# Patient Record
Sex: Male | Born: 1950 | Race: White | Hispanic: No | Marital: Married | Smoking: Never smoker
Health system: Southern US, Community
[De-identification: ages and names within clinical notes are randomized; demographics above are authoritative.]

## PROBLEM LIST (undated history)

## (undated) DIAGNOSIS — B019 Varicella without complication: Secondary | ICD-10-CM

## (undated) DIAGNOSIS — D869 Sarcoidosis, unspecified: Secondary | ICD-10-CM

## (undated) DIAGNOSIS — I10 Essential (primary) hypertension: Secondary | ICD-10-CM

## (undated) DIAGNOSIS — H409 Unspecified glaucoma: Secondary | ICD-10-CM

## (undated) HISTORY — DX: Varicella without complication: B01.9

## (undated) HISTORY — DX: Unspecified glaucoma: H40.9

## (undated) HISTORY — DX: Sarcoidosis, unspecified: D86.9

## (undated) HISTORY — PX: OTHER SURGICAL HISTORY: SHX169

---

## 1994-07-17 DIAGNOSIS — D869 Sarcoidosis, unspecified: Secondary | ICD-10-CM

## 1994-07-17 HISTORY — DX: Sarcoidosis, unspecified: D86.9

## 2012-07-29 LAB — HM COLONOSCOPY: HM Colonoscopy: NORMAL

## 2014-03-12 LAB — TSH: TSH: 1.32 u[IU]/mL (ref <=5.90)

## 2014-03-12 LAB — LIPID PANEL
Cholesterol: 153 mg/dL (ref 0–200)
HDL: 45 mg/dL (ref 35–70)
LDL CALC: 75 mg/dL
Triglycerides: 167 mg/dL — AB (ref 40–160)

## 2014-03-12 LAB — BASIC METABOLIC PANEL
CREATININE: 1 mg/dL (ref <=1.3)
Glucose: 115 mg/dL
SODIUM: 141 mmol/L (ref 137–147)

## 2014-03-12 LAB — CBC AND DIFFERENTIAL: WBC: 5.6 10*3/mL

## 2014-03-12 LAB — HEPATIC FUNCTION PANEL: Bilirubin, Total: 1.4 mg/dL

## 2014-07-29 DIAGNOSIS — M79673 Pain in unspecified foot: Secondary | ICD-10-CM | POA: Diagnosis not present

## 2014-09-08 DIAGNOSIS — H2513 Age-related nuclear cataract, bilateral: Secondary | ICD-10-CM | POA: Diagnosis not present

## 2014-09-08 DIAGNOSIS — H4011X1 Primary open-angle glaucoma, mild stage: Secondary | ICD-10-CM | POA: Diagnosis not present

## 2014-09-08 DIAGNOSIS — H524 Presbyopia: Secondary | ICD-10-CM | POA: Diagnosis not present

## 2015-01-21 DIAGNOSIS — H4011X1 Primary open-angle glaucoma, mild stage: Secondary | ICD-10-CM | POA: Diagnosis not present

## 2015-01-27 ENCOUNTER — Encounter (INDEPENDENT_AMBULATORY_CARE_PROVIDER_SITE_OTHER): Payer: Self-pay

## 2015-01-27 ENCOUNTER — Ambulatory Visit (INDEPENDENT_AMBULATORY_CARE_PROVIDER_SITE_OTHER): Payer: Medicare Other | Admitting: Nurse Practitioner

## 2015-01-27 ENCOUNTER — Encounter: Payer: Self-pay | Admitting: Nurse Practitioner

## 2015-01-27 ENCOUNTER — Ambulatory Visit (INDEPENDENT_AMBULATORY_CARE_PROVIDER_SITE_OTHER)
Admission: RE | Admit: 2015-01-27 | Discharge: 2015-01-27 | Disposition: A | Discharge status: Home / Self Care | Payer: Medicare Other | Source: Ambulatory Visit | Attending: Nurse Practitioner | Admitting: Nurse Practitioner

## 2015-01-27 VITALS — BP 162/96 | HR 65 | Temp 98.2°F | Resp 14 | Ht 74.0 in | Wt 206.6 lb

## 2015-01-27 DIAGNOSIS — E785 Hyperlipidemia, unspecified: Secondary | ICD-10-CM | POA: Insufficient documentation

## 2015-01-27 DIAGNOSIS — Z7689 Persons encountering health services in other specified circumstances: Secondary | ICD-10-CM | POA: Insufficient documentation

## 2015-01-27 DIAGNOSIS — M1712 Unilateral primary osteoarthritis, left knee: Secondary | ICD-10-CM | POA: Diagnosis not present

## 2015-01-27 DIAGNOSIS — D696 Thrombocytopenia, unspecified: Secondary | ICD-10-CM | POA: Diagnosis not present

## 2015-01-27 DIAGNOSIS — M25562 Pain in left knee: Secondary | ICD-10-CM

## 2015-01-27 DIAGNOSIS — Z7189 Other specified counseling: Secondary | ICD-10-CM

## 2015-01-27 DIAGNOSIS — Z79899 Other long term (current) drug therapy: Secondary | ICD-10-CM | POA: Diagnosis not present

## 2015-01-27 DIAGNOSIS — I10 Essential (primary) hypertension: Secondary | ICD-10-CM

## 2015-01-27 LAB — COMPREHENSIVE METABOLIC PANEL
ALT: 33 U/L (ref 0–53)
AST: 27 U/L (ref 0–37)
Albumin: 4.4 g/dL (ref 3.5–5.2)
Alkaline Phosphatase: 62 U/L (ref 39–117)
BILIRUBIN TOTAL: 1.3 mg/dL — AB (ref 0.2–1.2)
BUN: 13 mg/dL (ref 6–23)
CO2: 26 meq/L (ref 19–32)
CREATININE: 1.11 mg/dL (ref 0.40–1.50)
Calcium: 9.8 mg/dL (ref 8.4–10.5)
Chloride: 105 mEq/L (ref 96–112)
GFR: 70.41 mL/min (ref >60.00)
GLUCOSE: 112 mg/dL — AB (ref 70–99)
Potassium: 4.3 mEq/L (ref 3.5–5.1)
Sodium: 140 mEq/L (ref 135–145)
TOTAL PROTEIN: 7.3 g/dL (ref 6.0–8.3)

## 2015-01-27 LAB — CBC WITH DIFFERENTIAL/PLATELET
BASOS PCT: 0.3 % (ref 0.0–3.0)
Basophils Absolute: 0 10*3/uL (ref 0.0–0.1)
Eosinophils Absolute: 0.1 10*3/uL (ref 0.0–0.7)
Eosinophils Relative: 0.8 % (ref 0.0–5.0)
HEMATOCRIT: 50 % (ref 39.0–52.0)
Hemoglobin: 16.8 g/dL (ref 13.0–17.0)
Lymphocytes Relative: 32.8 % (ref 12.0–46.0)
Lymphs Abs: 2.1 10*3/uL (ref 0.7–4.0)
MCHC: 33.7 g/dL (ref 30.0–36.0)
MCV: 90.9 fl (ref 78.0–100.0)
MONO ABS: 0.5 10*3/uL (ref 0.1–1.0)
Monocytes Relative: 7.9 % (ref 3.0–12.0)
NEUTROS ABS: 3.7 10*3/uL (ref 1.4–7.7)
NEUTROS PCT: 58.2 % (ref 43.0–77.0)
PLATELETS: 126 10*3/uL — AB (ref 150.0–400.0)
RBC: 5.49 Mil/uL (ref 4.22–5.81)
RDW: 14.6 % (ref 11.5–15.5)
WBC: 6.4 10*3/uL (ref 4.0–10.5)

## 2015-01-27 LAB — HEPATIC FUNCTION PANEL
ALBUMIN: 4.4 g/dL (ref 3.5–5.2)
ALK PHOS: 62 U/L (ref 39–117)
ALT: 33 U/L (ref 0–53)
AST: 27 U/L (ref 0–37)
Bilirubin, Direct: 0.2 mg/dL (ref 0.0–0.3)
Total Bilirubin: 1.3 mg/dL — ABNORMAL HIGH (ref 0.2–1.2)
Total Protein: 7.3 g/dL (ref 6.0–8.3)

## 2015-01-27 LAB — HEMOGLOBIN A1C: Hgb A1c MFr Bld: 5 % (ref 4.6–6.5)

## 2015-01-27 NOTE — Assessment & Plan Note (Signed)
Will check a CBC w/ diff. Last one last year. Pt saw [facility] in 2015.

## 2015-01-27 NOTE — Assessment & Plan Note (Signed)
Discussed acute and chronic issues. Reviewed health maintenance measures, PFSHx, and immunizations. Obtain records.

## 2015-01-27 NOTE — Assessment & Plan Note (Signed)
Will obtain X-ray of left knee. Discussed RICE therapy. Will follow up in 1 month.

## 2015-01-27 NOTE — Assessment & Plan Note (Signed)
Just recently started back on statin, will obtain HFP.

## 2015-01-27 NOTE — Assessment & Plan Note (Signed)
Discussed his BP today. Started out 175/100 then down to 162/96 at the end of the visit he reports not being on medication and not checking regularly. Will follow up next month.   BP Readings from Last 3 Encounters:  01/27/15 162/96

## 2015-01-27 NOTE — Patient Instructions (Addendum)
Please visit the lab before leaving today.   We will contact you about knee x-ray results.   [facility] at [facility] Address:  Address: [address], [place] [address]  [phone] X-rays until 4 pm.    Follow up in 6 months.  Welcome to [facility]!

## 2015-01-27 NOTE — Progress Notes (Signed)
Subjective:    Patient ID: Brett Bradshaw, male    DOB: [birth year], 64 y.o.   MRN: [id number]  HPI  [name] is a 64 yo male establishing care and CC of left knee pain.   2015 saw [facility] for sacroidosis and thrombocytopenia   1) New pt info:   Immunizations- Unknown, Zostavax 2015, refuses flu shots, Would like to defer to read more about these pneumonia vaccinations.   Colonoscopy- 2014, no changes, receives every 5 years   Eye Exam- UTD   Dental Exam- UTD   2) Chronic Problems-  Hyperlipidemia- Stopped statin for awhile   Glaucoma- bilateral, takes eye drops, sees [facility], see him every 6 months   Thrombocytopenia- Need to have CBC w/ diff every 6 month, no bruising or bleeding easily.   Sarcoidosis- Dermatologist seeing them annually for full body scan.   Statin- restarted 3 weeks ago  BP- does not check at home, pt report white coat syndrome   3) Acute Problems-  Left knee pain-  Muscle cramps in lower legs last year, tendinitis in left leg and knee pain, ibuprofen every few hours and improved, knee pain was intermittent after this, stopped statin to see if this was the concern, affecting mobility 2-3 weeks starting back, exercising 2 x a week 20 min treadmill and lift weights for 1 hour.   Morning worse and decreases but still there during the day    Lower knee, fluctuates, walking helps  No x-rays, no PT, denies popping, clicking  Review of Systems  Constitutional: Negative for fever, chills, diaphoresis and fatigue.  Eyes: Negative for visual disturbance.  Respiratory: Negative for chest tightness, shortness of breath and wheezing.   Cardiovascular: Negative for chest pain, palpitations and leg swelling.  Gastrointestinal: Negative for nausea, vomiting, diarrhea and constipation.  Musculoskeletal: Positive for arthralgias.  Skin: Negative for rash.  Neurological: Negative for dizziness, weakness and numbness.    Psychiatric/Behavioral: Negative for suicidal ideas and sleep disturbance. The patient is not nervous/anxious.    Past Medical History  Diagnosis Date  . Chicken pox   . Glaucoma     Bilateral eyes  . Sarcoidosis     History   Social History  . Marital Status: Married    Spouse Name: N/A  . Number of Children: N/A  . Years of Education: N/A   Occupational History  . Not on file.   Social History Main Topics  . Smoking status: Never Smoker   . Smokeless tobacco: Not on file  . Alcohol Use: 0.0 oz/week    0 Standard drinks or equivalent per week     Comment: Ocassionally   . Drug Use: No  . Sexual Activity:    Partners: Female     Comment: Wife   Other Topics Concern  . Not on file   Social History Narrative   Works as a Chief of Staff     Lives with wife   Children- 2    Pets: none currently    Caffeine- No soda, 1 cup of coffee daily    Enjoys keeping up with financial information     No past surgical history on file.  Family History  Problem Relation Age of Onset  . Diabetes Brother     Type I  . Cancer Paternal Uncle     Prostate    Not on File  No current outpatient prescriptions on file prior to visit.   No current facility-administered medications on file  prior to visit.        Objective:   Physical Exam  Constitutional: He is oriented to person, place, and time. He appears well-developed and well-nourished. No distress.  BP 175/100 mmHg  Pulse 65  Temp(Src) 98.2 F (36.8 C)  Resp 18  Ht 6\' 2"  (1.88 m)  Wt 206 lb 9.6 oz (93.713 kg)  BMI 26.51 kg/m2  SpO2 98% 162/96 Repeat and respirations 14 repeat  HENT:  Head: Normocephalic and atraumatic.  Right Ear: External ear normal.  Left Ear: External ear normal.  Nose: Nose normal.  Mouth/Throat: Oropharynx is clear and moist. No oropharyngeal exudate.  Eyes: Conjunctivae and EOM are normal. Pupils are equal, round, and reactive to light. Right eye exhibits no discharge. Left eye  exhibits no discharge. No scleral icterus.  Neck: Normal range of motion. Neck supple. No thyromegaly present.  Cardiovascular: Normal rate, regular rhythm, normal heart sounds and intact distal pulses.  Exam reveals no gallop and no friction rub.   No murmur heard. Pulmonary/Chest: Effort normal and breath sounds normal. No respiratory distress. He has no wheezes. He has no rales. He exhibits no tenderness.  Musculoskeletal: Normal range of motion. He exhibits tenderness. He exhibits no edema.  Lymphadenopathy:    He has no cervical adenopathy.  Neurological: He is alert and oriented to person, place, and time. He displays normal reflexes. No cranial nerve deficit. He exhibits normal muscle tone. Coordination normal.  Skin: Skin is warm and dry. No rash noted. He is not diaphoretic.  Psychiatric: He has a normal mood and affect. His behavior is normal. Judgment and thought content normal.          Assessment & Plan:

## 2015-01-28 ENCOUNTER — Telehealth: Payer: Self-pay | Admitting: Nurse Practitioner

## 2015-01-28 ENCOUNTER — Other Ambulatory Visit: Payer: Self-pay | Admitting: Nurse Practitioner

## 2015-01-28 DIAGNOSIS — M1712 Unilateral primary osteoarthritis, left knee: Secondary | ICD-10-CM

## 2015-01-28 NOTE — Telephone Encounter (Signed)
Patient called requesting result from his x-ray and labs.

## 2015-01-29 DIAGNOSIS — D485 Neoplasm of uncertain behavior of skin: Secondary | ICD-10-CM | POA: Diagnosis not present

## 2015-01-29 DIAGNOSIS — L570 Actinic keratosis: Secondary | ICD-10-CM | POA: Diagnosis not present

## 2015-01-29 DIAGNOSIS — L821 Other seborrheic keratosis: Secondary | ICD-10-CM | POA: Diagnosis not present

## 2015-01-29 DIAGNOSIS — Z85828 Personal history of other malignant neoplasm of skin: Secondary | ICD-10-CM | POA: Diagnosis not present

## 2015-01-29 DIAGNOSIS — D229 Melanocytic nevi, unspecified: Secondary | ICD-10-CM | POA: Diagnosis not present

## 2015-01-29 DIAGNOSIS — L814 Other melanin hyperpigmentation: Secondary | ICD-10-CM | POA: Diagnosis not present

## 2015-01-29 DIAGNOSIS — D1801 Hemangioma of skin and subcutaneous tissue: Secondary | ICD-10-CM | POA: Diagnosis not present

## 2015-01-29 DIAGNOSIS — L739 Follicular disorder, unspecified: Secondary | ICD-10-CM | POA: Diagnosis not present

## 2015-02-26 ENCOUNTER — Other Ambulatory Visit: Payer: Self-pay

## 2015-02-26 MED ORDER — ATORVASTATIN CALCIUM 20 MG PO TABS: 20.0000 mg | ORAL_TABLET | Freq: Every day | ORAL | Status: DC

## 2015-03-05 DIAGNOSIS — M25562 Pain in left knee: Secondary | ICD-10-CM | POA: Diagnosis not present

## 2015-03-05 DIAGNOSIS — M1712 Unilateral primary osteoarthritis, left knee: Secondary | ICD-10-CM | POA: Diagnosis not present

## 2015-04-06 ENCOUNTER — Other Ambulatory Visit: Payer: Self-pay | Admitting: *Deleted

## 2015-04-06 MED ORDER — ATORVASTATIN CALCIUM 20 MG PO TABS: 20.0000 mg | ORAL_TABLET | Freq: Every day | ORAL | Status: DC

## 2015-05-10 ENCOUNTER — Other Ambulatory Visit: Payer: Self-pay | Admitting: Nurse Practitioner

## 2015-05-10 ENCOUNTER — Other Ambulatory Visit: Payer: Self-pay

## 2015-05-10 MED ORDER — ATORVASTATIN CALCIUM 20 MG PO TABS: ORAL_TABLET | ORAL | Status: DC

## 2015-05-26 DIAGNOSIS — H401131 Primary open-angle glaucoma, bilateral, mild stage: Secondary | ICD-10-CM | POA: Diagnosis not present

## 2015-09-09 ENCOUNTER — Ambulatory Visit (INDEPENDENT_AMBULATORY_CARE_PROVIDER_SITE_OTHER): Payer: Medicare Other | Admitting: Nurse Practitioner

## 2015-09-09 ENCOUNTER — Encounter: Payer: Self-pay | Admitting: Nurse Practitioner

## 2015-09-09 DIAGNOSIS — Z79899 Other long term (current) drug therapy: Secondary | ICD-10-CM

## 2015-09-09 DIAGNOSIS — I10 Essential (primary) hypertension: Secondary | ICD-10-CM | POA: Diagnosis not present

## 2015-09-09 DIAGNOSIS — Z23 Encounter for immunization: Secondary | ICD-10-CM | POA: Diagnosis not present

## 2015-09-09 DIAGNOSIS — D696 Thrombocytopenia, unspecified: Secondary | ICD-10-CM

## 2015-09-09 LAB — COMPREHENSIVE METABOLIC PANEL
ALBUMIN: 4.7 g/dL (ref 3.5–5.2)
ALK PHOS: 67 U/L (ref 39–117)
ALT: 26 U/L (ref 0–53)
AST: 22 U/L (ref 0–37)
BILIRUBIN TOTAL: 2 mg/dL — AB (ref 0.2–1.2)
BUN: 19 mg/dL (ref 6–23)
CO2: 28 mEq/L (ref 19–32)
Calcium: 9.6 mg/dL (ref 8.4–10.5)
Chloride: 102 mEq/L (ref 96–112)
Creatinine, Ser: 1.13 mg/dL (ref 0.40–1.50)
GFR: 68.84 mL/min (ref >60.00)
GLUCOSE: 127 mg/dL — AB (ref 70–99)
POTASSIUM: 4.5 meq/L (ref 3.5–5.1)
SODIUM: 139 meq/L (ref 135–145)
TOTAL PROTEIN: 7.5 g/dL (ref 6.0–8.3)

## 2015-09-09 LAB — CBC WITH DIFFERENTIAL/PLATELET
Basophils Absolute: 0 10*3/uL (ref 0.0–0.1)
Basophils Relative: 0.3 % (ref 0.0–3.0)
EOS ABS: 0.2 10*3/uL (ref 0.0–0.7)
Eosinophils Relative: 2.1 % (ref 0.0–5.0)
HCT: 49.6 % (ref 39.0–52.0)
HEMOGLOBIN: 17 g/dL (ref 13.0–17.0)
Lymphocytes Relative: 20.4 % (ref 12.0–46.0)
Lymphs Abs: 1.5 10*3/uL (ref 0.7–4.0)
MCHC: 34.4 g/dL (ref 30.0–36.0)
MCV: 91.7 fl (ref 78.0–100.0)
MONO ABS: 0.7 10*3/uL (ref 0.1–1.0)
Monocytes Relative: 9.7 % (ref 3.0–12.0)
Neutro Abs: 5.1 10*3/uL (ref 1.4–7.7)
Neutrophils Relative %: 67.5 % (ref 43.0–77.0)
Platelets: 129 10*3/uL — ABNORMAL LOW (ref 150.0–400.0)
RBC: 5.41 Mil/uL (ref 4.22–5.81)
RDW: 13.2 % (ref 11.5–15.5)
WBC: 7.5 10*3/uL (ref 4.0–10.5)

## 2015-09-09 LAB — LIPID PANEL
CHOLESTEROL: 168 mg/dL (ref 0–200)
HDL: 41.3 mg/dL (ref >39.00)
LDL Cholesterol: 91 mg/dL (ref 0–99)
NONHDL: 126.55
Total CHOL/HDL Ratio: 4
Triglycerides: 177 mg/dL — ABNORMAL HIGH (ref 0.0–149.0)
VLDL: 35.4 mg/dL (ref 0.0–40.0)

## 2015-09-09 MED ORDER — ATORVASTATIN CALCIUM 20 MG PO TABS: ORAL_TABLET | ORAL | Status: DC

## 2015-09-09 NOTE — Progress Notes (Addendum)
Patient ID: Brett Bradshaw, male    DOB: [birth year]  Age: 65 y.o. MRN: [id number]  CC: Follow-up   HPI Brett Bradshaw presents for follow up of statin therapy.   1) Statin therapy and health maintenance  Diet- No changes  Exercise- No changes  Immunizations-    Flu- Yes   Tdap- Today    Zostavax- 2015    PNA- Declined  Colonoscopy- UTD, Due again in 2019   Refills: Needs lipitor refilled  History Brett Bradshaw has a past medical history of Chicken pox; Glaucoma; and Sarcoidosis ([place]).   He has no past surgical history on file.   His family history includes Cancer in his paternal uncle; Diabetes in his brother.He reports that he has never smoked. He does not have any smokeless tobacco history on file. He reports that he drinks alcohol. He reports that he does not use illicit drugs.  Outpatient Prescriptions Prior to Visit  Medication Sig Dispense Refill  . atorvastatin (LIPITOR) 20 MG tablet TAKE 1 TABLET ONCE DAILY AT 6 PM. 30 tablet 6   No facility-administered medications prior to visit.    ROS Review of Systems  Constitutional: Negative for fever, chills, diaphoresis and fatigue.  HENT: Positive for rhinorrhea and sinus pressure. Negative for congestion.   Eyes: Negative for visual disturbance.  Respiratory: Positive for cough. Negative for chest tightness, shortness of breath and wheezing.   Cardiovascular: Negative for chest pain, palpitations and leg swelling.  Gastrointestinal: Negative for nausea, vomiting and diarrhea.  Genitourinary: Negative for difficulty urinating.  Musculoskeletal: Negative for myalgias, back pain, neck pain and neck stiffness.  Skin: Negative for rash.  Neurological: Negative for dizziness, weakness, numbness and headaches.  Psychiatric/Behavioral: Negative for suicidal ideas and sleep disturbance. The patient is not nervous/anxious.     Objective:  BP 122/72 mmHg  Pulse 75  Temp(Src) 98.1 F (36.7 C) (Oral)  Resp 18  Ht 6\' 2"  (1.88 m)  Wt  209 lb (94.802 kg)  BMI 26.82 kg/m2  SpO2 98%  Physical Exam  Constitutional: He is oriented to person, place, and time. He appears well-developed and well-nourished. No distress.  HENT:  Head: Normocephalic and atraumatic.  Right Ear: External ear normal.  Left Ear: External ear normal.  Nose: Nose normal.  Mouth/Throat: Oropharynx is clear and moist. No oropharyngeal exudate.  Eyes: Conjunctivae and EOM are normal. Pupils are equal, round, and reactive to light. Right eye exhibits no discharge. Left eye exhibits no discharge. No scleral icterus.  Glasses  Neck: Normal range of motion. Neck supple. No thyromegaly present.  Cardiovascular: Normal rate, regular rhythm, normal heart sounds and intact distal pulses.  Exam reveals no gallop and no friction rub.   No murmur heard. Pulmonary/Chest: Effort normal and breath sounds normal. No respiratory distress. He has no wheezes. He has no rales. He exhibits no tenderness.  Abdominal: Soft. Bowel sounds are normal. He exhibits no distension and no mass. There is no tenderness. There is no rebound and no guarding.  Musculoskeletal: Normal range of motion. He exhibits no edema or tenderness.  Lymphadenopathy:    He has no cervical adenopathy.  Neurological: He is alert and oriented to person, place, and time. He displays normal reflexes. No cranial nerve deficit. He exhibits normal muscle tone. Coordination normal.  Skin: Skin is warm and dry. No rash noted. He is not diaphoretic.  Psychiatric: He has a normal mood and affect. His behavior is normal. Judgment and thought content normal.   Assessment & Plan:  Brett Bradshaw was seen today for follow-up.  Diagnoses and all orders for this visit:  Routine general medical examination at a health care facility -     CBC w/Diff -     Comprehensive metabolic panel -     Lipid panel  Need for prophylactic vaccination with combined diphtheria-tetanus-pertussis (DTP) vaccine  Encounter for  immunization -     Flu Vaccine QUAD 36+ mos IM  Benign essential HTN  On statin therapy  Thrombocytopenia (HCC)  Other orders -     atorvastatin (LIPITOR) 20 MG tablet; TAKE 1 TABLET ONCE DAILY AT 6 PM. -     Td : Tetanus/diphtheria >7yo Preservative  free   I am having Brett Bradshaw maintain his atorvastatin.  Meds ordered this encounter  Medications  . atorvastatin (LIPITOR) 20 MG tablet    Sig: TAKE 1 TABLET ONCE DAILY AT 6 PM.    Dispense:  30 tablet    Refill:  6    Order Specific Question:  Supervising Provider    Answer:  Crecencio Mc [[id number]]     Follow-up: Return in about 1 year (around 09/08/2016) for CPE w/ labs.

## 2015-09-09 NOTE — Patient Instructions (Signed)

## 2015-09-09 NOTE — Progress Notes (Signed)
Pre visit review using our clinic review tool, if applicable. No additional management support is needed unless otherwise documented below in the visit note. 

## 2015-09-14 DIAGNOSIS — Z23 Encounter for immunization: Secondary | ICD-10-CM | POA: Insufficient documentation

## 2015-09-14 DIAGNOSIS — Z0000 Encounter for general adult medical examination without abnormal findings: Secondary | ICD-10-CM | POA: Insufficient documentation

## 2015-09-14 NOTE — Assessment & Plan Note (Signed)
Discussed acute and chronic issues. Reviewed health maintenance measures, PFSHx, and immunizations. Obtain routine labs Lipid panel, CBC w/ diff, and CMET.   TD and Flu were given today  Declined Pna vaccine

## 2015-09-14 NOTE — Assessment & Plan Note (Signed)
Stable. Checking CBC w/ diff today

## 2015-09-14 NOTE — Assessment & Plan Note (Signed)
Stable Checking Lipid and CMET today  Continue Lipitor- refills sent to pharmacy

## 2015-09-14 NOTE — Assessment & Plan Note (Signed)
BP Readings from Last 3 Encounters:  09/09/15 122/72  01/27/15 162/96   Stable. No medications currently

## 2015-10-04 DIAGNOSIS — H2513 Age-related nuclear cataract, bilateral: Secondary | ICD-10-CM | POA: Diagnosis not present

## 2015-10-04 DIAGNOSIS — H401131 Primary open-angle glaucoma, bilateral, mild stage: Secondary | ICD-10-CM | POA: Diagnosis not present

## 2015-10-04 DIAGNOSIS — H524 Presbyopia: Secondary | ICD-10-CM | POA: Diagnosis not present

## 2015-10-10 NOTE — Addendum Note (Signed)
Addended by: Rubbie Battiest on: 10/10/2015 10:26 PM   Modules accepted: Level of Service

## 2015-11-02 DIAGNOSIS — Z1211 Encounter for screening for malignant neoplasm of colon: Secondary | ICD-10-CM | POA: Diagnosis not present

## 2015-11-02 DIAGNOSIS — Z86010 Personal history of colonic polyps: Secondary | ICD-10-CM | POA: Diagnosis not present

## 2015-11-02 DIAGNOSIS — Q438 Other specified congenital malformations of intestine: Secondary | ICD-10-CM | POA: Diagnosis not present

## 2015-11-02 LAB — HM COLONOSCOPY

## 2015-11-07 ENCOUNTER — Encounter: Payer: Self-pay | Admitting: Nurse Practitioner

## 2016-01-31 DIAGNOSIS — H401131 Primary open-angle glaucoma, bilateral, mild stage: Secondary | ICD-10-CM | POA: Diagnosis not present

## 2016-05-10 ENCOUNTER — Other Ambulatory Visit: Payer: Self-pay

## 2016-05-10 DIAGNOSIS — Z85828 Personal history of other malignant neoplasm of skin: Secondary | ICD-10-CM | POA: Diagnosis not present

## 2016-05-10 DIAGNOSIS — D2260 Melanocytic nevi of unspecified upper limb, including shoulder: Secondary | ICD-10-CM | POA: Diagnosis not present

## 2016-05-10 DIAGNOSIS — D2270 Melanocytic nevi of unspecified lower limb, including hip: Secondary | ICD-10-CM | POA: Diagnosis not present

## 2016-05-10 DIAGNOSIS — I781 Nevus, non-neoplastic: Secondary | ICD-10-CM | POA: Diagnosis not present

## 2016-05-10 DIAGNOSIS — L304 Erythema intertrigo: Secondary | ICD-10-CM | POA: Diagnosis not present

## 2016-05-10 DIAGNOSIS — L814 Other melanin hyperpigmentation: Secondary | ICD-10-CM | POA: Diagnosis not present

## 2016-05-10 DIAGNOSIS — D225 Melanocytic nevi of trunk: Secondary | ICD-10-CM | POA: Diagnosis not present

## 2016-05-10 DIAGNOSIS — L821 Other seborrheic keratosis: Secondary | ICD-10-CM | POA: Diagnosis not present

## 2016-05-10 MED ORDER — ATORVASTATIN CALCIUM 20 MG PO TABS: ORAL_TABLET | ORAL | 6 refills | Status: DC

## 2016-05-16 ENCOUNTER — Ambulatory Visit (INDEPENDENT_AMBULATORY_CARE_PROVIDER_SITE_OTHER): Payer: Medicare Other | Admitting: Family

## 2016-05-16 ENCOUNTER — Encounter: Payer: Self-pay | Admitting: Family

## 2016-05-16 VITALS — BP 138/82 | HR 71 | Temp 97.6°F | Ht 74.0 in | Wt 208.4 lb

## 2016-05-16 DIAGNOSIS — D696 Thrombocytopenia, unspecified: Secondary | ICD-10-CM

## 2016-05-16 DIAGNOSIS — Z7689 Persons encountering health services in other specified circumstances: Secondary | ICD-10-CM

## 2016-05-16 DIAGNOSIS — Z79899 Other long term (current) drug therapy: Secondary | ICD-10-CM | POA: Diagnosis not present

## 2016-05-16 LAB — CBC WITH DIFFERENTIAL/PLATELET
Basophils Absolute: 0 10*3/uL (ref 0.0–0.1)
Basophils Relative: 0.3 % (ref 0.0–3.0)
EOS PCT: 0.7 % (ref 0.0–5.0)
Eosinophils Absolute: 0 10*3/uL (ref 0.0–0.7)
HCT: 48.2 % (ref 39.0–52.0)
Hemoglobin: 16.9 g/dL (ref 13.0–17.0)
LYMPHS ABS: 1.4 10*3/uL (ref 0.7–4.0)
Lymphocytes Relative: 21.8 % (ref 12.0–46.0)
MCHC: 35 g/dL (ref 30.0–36.0)
MCV: 92.3 fl (ref 78.0–100.0)
MONO ABS: 0.5 10*3/uL (ref 0.1–1.0)
MONOS PCT: 7.3 % (ref 3.0–12.0)
NEUTROS ABS: 4.5 10*3/uL (ref 1.4–7.7)
NEUTROS PCT: 69.9 % (ref 43.0–77.0)
Platelets: 127 10*3/uL — ABNORMAL LOW (ref 150.0–400.0)
RBC: 5.23 Mil/uL (ref 4.22–5.81)
RDW: 13.7 % (ref 11.5–15.5)
WBC: 6.4 10*3/uL (ref 4.0–10.5)

## 2016-05-16 MED ORDER — ATORVASTATIN CALCIUM 20 MG PO TABS: ORAL_TABLET | ORAL | 3 refills | Status: DC

## 2016-05-16 NOTE — Patient Instructions (Signed)
Lab work  Follow up for physical

## 2016-05-16 NOTE — Progress Notes (Signed)
Pre visit review using our clinic review tool, if applicable. No additional management support is needed unless otherwise documented below in the visit note. 

## 2016-05-16 NOTE — Assessment & Plan Note (Addendum)
Reviewed past medical. Social, and family history. Patient will return for CPE and screening labs. Will request colonoscopy records.

## 2016-05-16 NOTE — Progress Notes (Signed)
Subjective:    Patient ID: Brett Bradshaw, male    DOB: [birth year], 65 y.o.   MRN: [id number]  CC: Saiyan Roccaforte is a 65 y.o. male who presents today for follow up.   HPI: Patient here for follow-up and to establish care.  Sarcoidosis- He does not have.  in the 90s, patient states he had a nodule on his lung ; s/p biopsy and it was benign. We do not have results of this from [facility]. He says this is not an accurate diagnosis. We'll request these records for clarity.  Thrombocytopenia- chronic. Stable. Had seen hematology at [facility] with no cause per patient.    He had colonoscopy this year. No records.  Overdue on health maintenance. Due for pneumonia vaccine- declines at this time.       HISTORY:  Past Medical History:  Diagnosis Date  . Chicken pox   . Glaucoma    Bilateral eyes  . Sarcoidosis ([place]) 1996   History reviewed. No pertinent surgical history. Family History  Problem Relation Age of Onset  . Cancer Paternal Uncle     Prostate  . Diabetes Brother     Type I    Allergies: Review of patient's allergies indicates not on file. No current outpatient prescriptions on file prior to visit.   No current facility-administered medications on file prior to visit.     Social History  Substance Use Topics  . Smoking status: Never Smoker  . Smokeless tobacco: Never Used  . Alcohol use 0.0 oz/week     Comment: Ocassionally     Review of Systems  Constitutional: Negative for chills and fever.  HENT: Negative for congestion, ear pain, rhinorrhea, sinus pressure and sore throat.   Respiratory: Negative for cough, shortness of breath and wheezing.   Cardiovascular: Negative for chest pain and palpitations.  Gastrointestinal: Negative for diarrhea, nausea and vomiting.  Genitourinary: Negative for dysuria.  Musculoskeletal: Negative for myalgias.  Skin: Negative for rash.  Neurological: Negative for headaches.  Hematological: Negative for adenopathy. Does not  bruise/bleed easily.      Objective:    BP 138/82   Pulse 71   Temp 97.6 F (36.4 C) (Oral)   Ht 6\' 2"  (1.88 m)   Wt 208 lb 6.4 oz (94.5 kg)   SpO2 98%   BMI 26.76 kg/m  BP Readings from Last 3 Encounters:  05/16/16 138/82  09/09/15 122/72  01/27/15 (!) 162/96   Wt Readings from Last 3 Encounters:  05/16/16 208 lb 6.4 oz (94.5 kg)  09/09/15 209 lb (94.8 kg)  01/27/15 206 lb 9.6 oz (93.7 kg)    Physical Exam  Constitutional: He appears well-developed and well-nourished.  Neck: No thyroid mass and no thyromegaly present.  Cardiovascular: Regular rhythm and normal heart sounds.   Pulmonary/Chest: Effort normal and breath sounds normal. No respiratory distress. He has no wheezes. He has no rhonchi. He has no rales.  Lymphadenopathy:       Head (right side): No submental, no submandibular, no tonsillar, no preauricular, no posterior auricular and no occipital adenopathy present.       Head (left side): No submental, no submandibular, no tonsillar, no preauricular, no posterior auricular and no occipital adenopathy present.    He has no cervical adenopathy.    He has no axillary adenopathy.  Neurological: He is alert.  Skin: Skin is warm and dry.  Psychiatric: He has a normal mood and affect. His speech is normal and behavior is normal.  Vitals reviewed.  Assessment & Plan:   Problem List Items Addressed This Visit      Other   Encounter to establish care    Reviewed past medical. Social, and family history. Patient will return for CPE and screening labs. Will request colonoscopy records.       Thrombocytopenia ([place]) - Primary    Clinically stable.Pending smear and CBC to monitor.       Relevant Orders   CBC with Differential/Platelet   Pathologist smear review   On statin therapy    Stable. Will continue current regimen.       Relevant Medications   atorvastatin (LIPITOR) 20 MG tablet    Other Visit Diagnoses   None.      I am having Mr. Fiqueroa  maintain his latanoprost and atorvastatin.   Meds ordered this encounter  Medications  . latanoprost (XALATAN) 0.005 % ophthalmic solution  . atorvastatin (LIPITOR) 20 MG tablet    Sig: TAKE 1 TABLET ONCE DAILY AT 6 PM.    Dispense:  90 tablet    Refill:  3    Return precautions given.   Risks, benefits, and alternatives of the medications and treatment plan prescribed today were discussed, and patient expressed understanding.   Education regarding symptom management and diagnosis given to patient on AVS.  Continue to follow with Rubbie Battiest, NP for routine health maintenance.   [name] and I agreed with plan.   Mable Paris, FNP

## 2016-05-16 NOTE — Assessment & Plan Note (Signed)
Clinically stable.Pending smear and CBC to monitor.

## 2016-05-16 NOTE — Assessment & Plan Note (Signed)
Stable. Will continue current regimen.  

## 2016-05-17 LAB — PATHOLOGIST SMEAR REVIEW

## 2016-05-26 ENCOUNTER — Other Ambulatory Visit: Payer: Self-pay

## 2016-05-26 DIAGNOSIS — Z79899 Other long term (current) drug therapy: Secondary | ICD-10-CM

## 2016-05-26 MED ORDER — ATORVASTATIN CALCIUM 20 MG PO TABS: ORAL_TABLET | ORAL | 3 refills | Status: DC

## 2016-06-19 DIAGNOSIS — H401131 Primary open-angle glaucoma, bilateral, mild stage: Secondary | ICD-10-CM | POA: Diagnosis not present

## 2016-08-13 IMAGING — CR DG KNEE COMPLETE 4+V*L*
5 series · 5 of 5 positions shown · non-contrast
Comparison: None available in PACs.

CLINICAL DATA: Nine months of left knee pain with increasing
symptoms inferior to the patella, no history of trauma.

EXAM:
LEFT KNEE - COMPLETE 4+ VIEW

[view not recorded (1 of 5)]
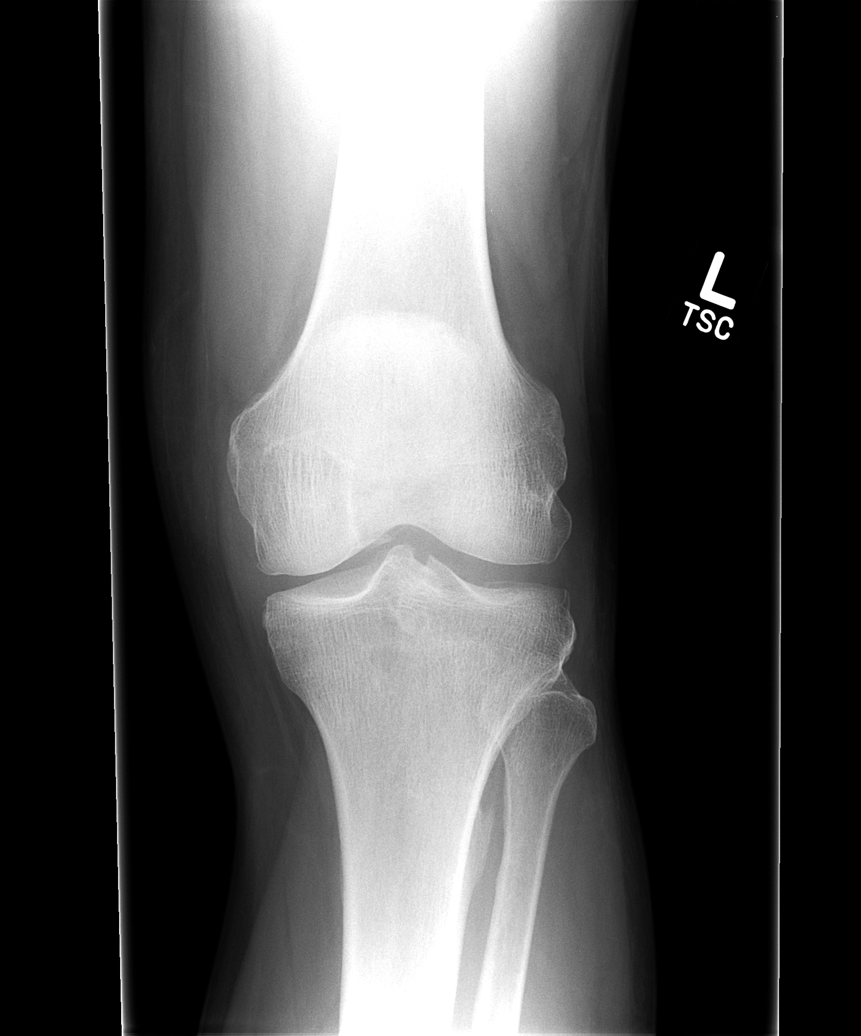

[view not recorded (2 of 5)]
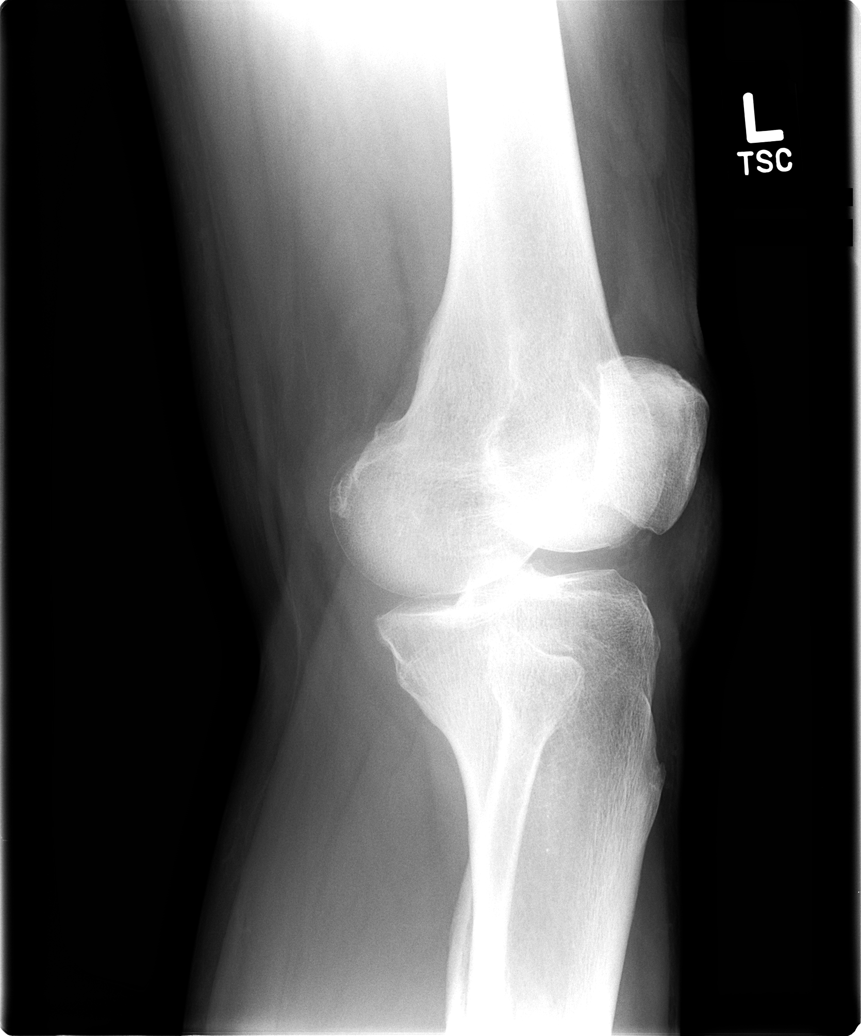

[view not recorded (3 of 5)]
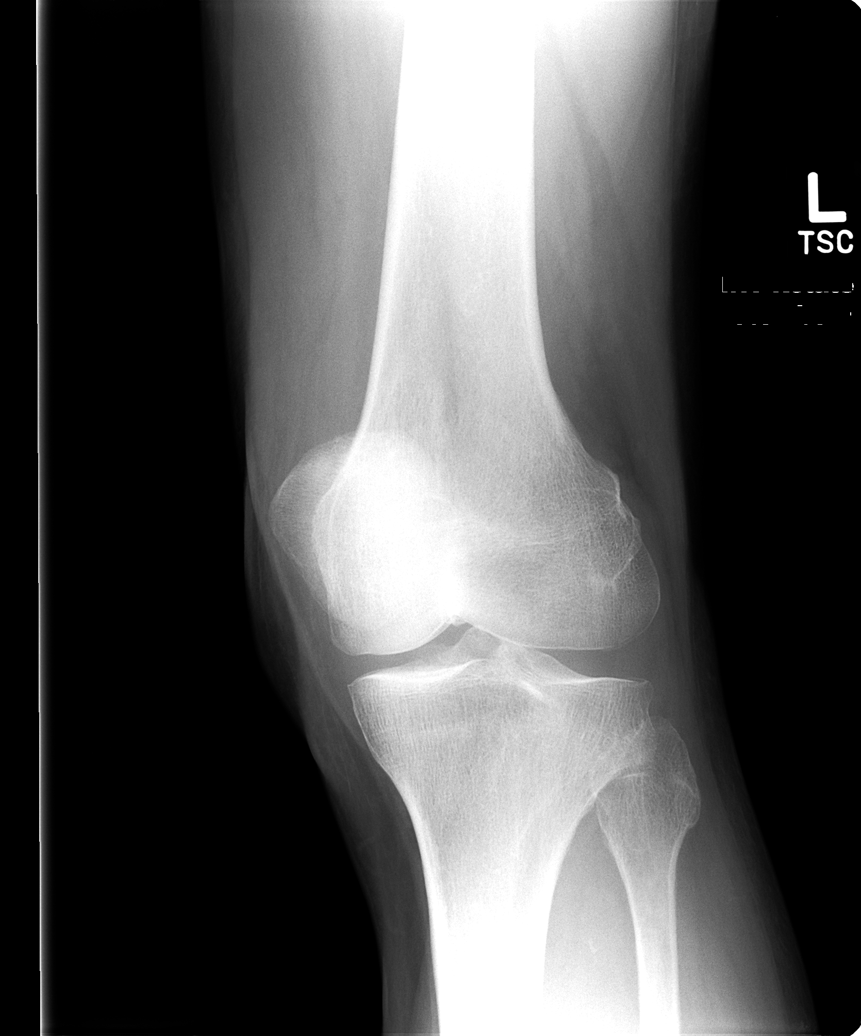

[view not recorded (4 of 5)]
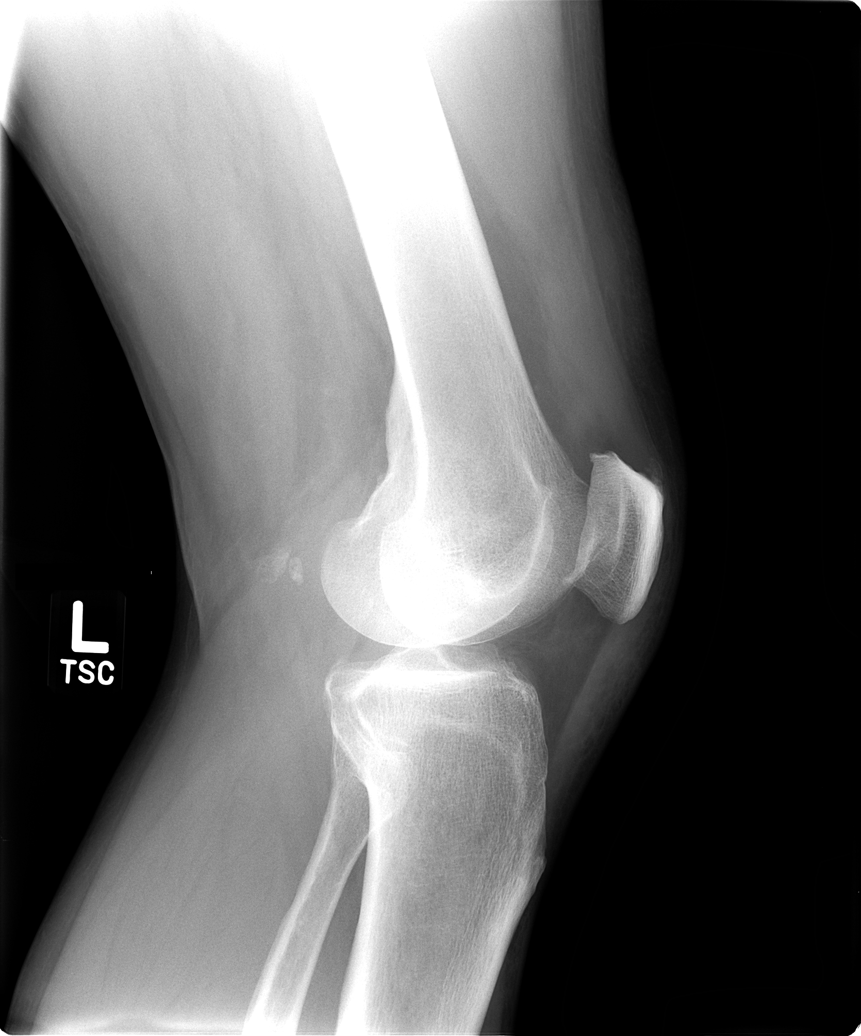

[view not recorded (5 of 5)]
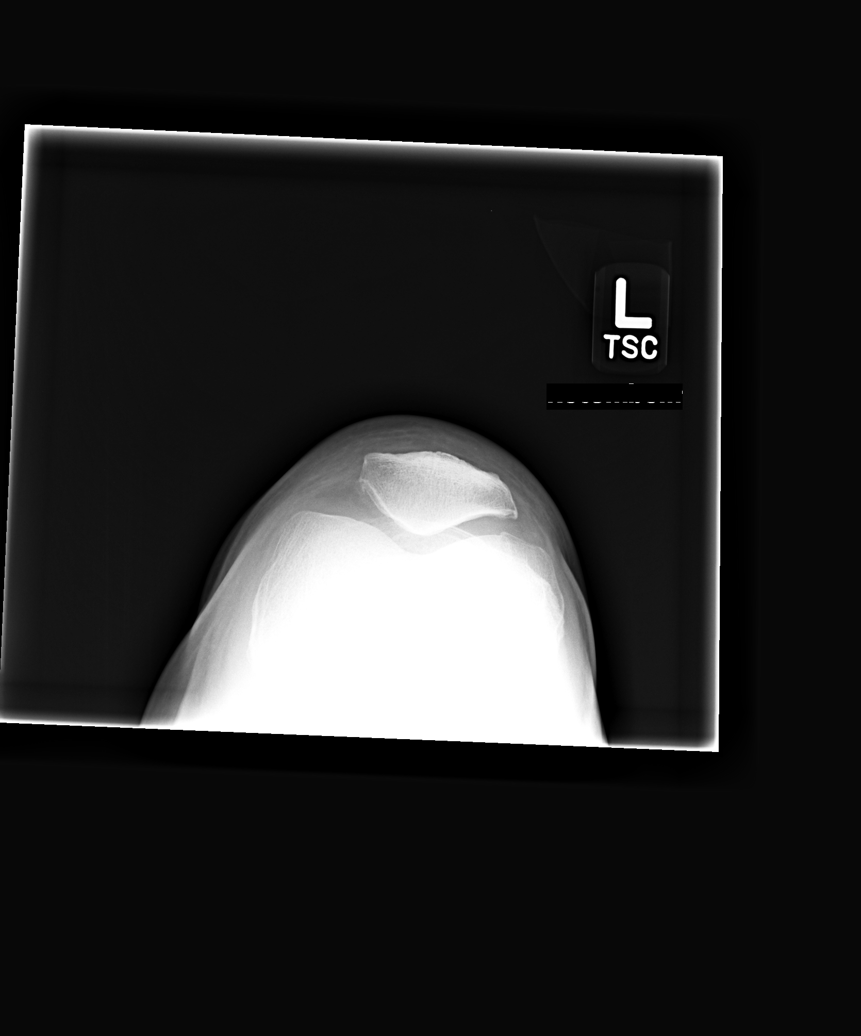

[5 of 5 positions shown; findings below may reference images not displayed]

FINDINGS: The bones are adequately mineralized. The joint spaces are preserved
on these nonweightbearing views. There is beaking of the tibial
spines. There are spurs arising from the articular margins of the
patella. The proximal fibula is intact. There is no joint effusion.
Specific attention to the infrapatellar soft tissues reveals no
abnormality.
IMPRESSION: There is mild degenerative change manifested by tibial spine beaking
and patellar osteophytes. There is no acute bony abnormality.

## 2016-09-11 ENCOUNTER — Encounter: Payer: Self-pay | Admitting: Family

## 2016-09-11 ENCOUNTER — Ambulatory Visit (INDEPENDENT_AMBULATORY_CARE_PROVIDER_SITE_OTHER): Payer: Medicare Other | Admitting: Family

## 2016-09-11 VITALS — BP 142/88 | HR 65 | Temp 98.4°F | Ht 72.25 in | Wt 206.4 lb

## 2016-09-11 DIAGNOSIS — Z79899 Other long term (current) drug therapy: Secondary | ICD-10-CM

## 2016-09-11 DIAGNOSIS — Z0000 Encounter for general adult medical examination without abnormal findings: Secondary | ICD-10-CM | POA: Diagnosis not present

## 2016-09-11 DIAGNOSIS — Z1321 Encounter for screening for nutritional disorder: Secondary | ICD-10-CM

## 2016-09-11 DIAGNOSIS — Z125 Encounter for screening for malignant neoplasm of prostate: Secondary | ICD-10-CM | POA: Diagnosis not present

## 2016-09-11 LAB — CBC WITH DIFFERENTIAL/PLATELET
BASOS ABS: 0 10*3/uL (ref 0.0–0.1)
BASOS PCT: 0.2 % (ref 0.0–3.0)
EOS ABS: 0.1 10*3/uL (ref 0.0–0.7)
Eosinophils Relative: 1 % (ref 0.0–5.0)
HEMATOCRIT: 46.2 % (ref 39.0–52.0)
Hemoglobin: 16.2 g/dL (ref 13.0–17.0)
LYMPHS PCT: 26 % (ref 12.0–46.0)
Lymphs Abs: 1.5 10*3/uL (ref 0.7–4.0)
MCHC: 35.1 g/dL (ref 30.0–36.0)
MCV: 92 fl (ref 78.0–100.0)
Monocytes Absolute: 0.4 10*3/uL (ref 0.1–1.0)
Monocytes Relative: 6.4 % (ref 3.0–12.0)
Neutro Abs: 3.7 10*3/uL (ref 1.4–7.7)
Neutrophils Relative %: 66.4 % (ref 43.0–77.0)
Platelets: 109 10*3/uL — ABNORMAL LOW (ref 150.0–400.0)
RBC: 5.02 Mil/uL (ref 4.22–5.81)
RDW: 13.8 % (ref 11.5–15.5)
WBC: 5.6 10*3/uL (ref 4.0–10.5)

## 2016-09-11 LAB — VITAMIN D 25 HYDROXY (VIT D DEFICIENCY, FRACTURES): VITD: 20.16 ng/mL — AB (ref 30.00–100.00)

## 2016-09-11 LAB — COMPREHENSIVE METABOLIC PANEL
ALK PHOS: 48 U/L (ref 39–117)
ALT: 17 U/L (ref 0–53)
AST: 17 U/L (ref 0–37)
Albumin: 4.4 g/dL (ref 3.5–5.2)
BILIRUBIN TOTAL: 1.1 mg/dL (ref 0.2–1.2)
BUN: 16 mg/dL (ref 6–23)
CALCIUM: 9.3 mg/dL (ref 8.4–10.5)
CO2: 29 mEq/L (ref 19–32)
CREATININE: 1 mg/dL (ref 0.40–1.50)
Chloride: 106 mEq/L (ref 96–112)
GFR: 79.03 mL/min (ref >60.00)
Glucose, Bld: 127 mg/dL — ABNORMAL HIGH (ref 70–99)
Potassium: 4.5 mEq/L (ref 3.5–5.1)
Sodium: 141 mEq/L (ref 135–145)
Total Protein: 6.5 g/dL (ref 6.0–8.3)

## 2016-09-11 LAB — HEMOGLOBIN A1C: HEMOGLOBIN A1C: 5.1 % (ref 4.6–6.5)

## 2016-09-11 LAB — TSH: TSH: 1.36 u[IU]/mL (ref 0.35–4.50)

## 2016-09-11 LAB — LIPID PANEL
CHOLESTEROL: 160 mg/dL (ref 0–200)
HDL: 50.6 mg/dL (ref >39.00)
LDL Cholesterol: 88 mg/dL (ref 0–99)
NonHDL: 109.57
Total CHOL/HDL Ratio: 3
Triglycerides: 110 mg/dL (ref 0.0–149.0)
VLDL: 22 mg/dL (ref 0.0–40.0)

## 2016-09-11 LAB — PSA: PSA: 4.55 ng/mL — ABNORMAL HIGH (ref 0.10–4.00)

## 2016-09-11 MED ORDER — PNEUMOCOCCAL 13-VAL CONJ VACC IM SUSP
0.5000 mL | Freq: Once | INTRAMUSCULAR | Status: AC
  Administered 2016-09-11: 0.5 mL via INTRAMUSCULAR

## 2016-09-11 NOTE — Patient Instructions (Addendum)
Please bring report of colonoscopy as we do not have   Labs today   Health Maintenance, Male A healthy lifestyle and preventative care can promote health and wellness.  Maintain regular health, dental, and eye exams.  Eat a healthy diet. Foods like vegetables, fruits, whole grains, low-fat dairy products, and lean protein foods contain the nutrients you need and are low in calories. Decrease your intake of foods high in solid fats, added sugars, and salt. Get information about a proper diet from your health care provider, if necessary.  Regular physical exercise is one of the most important things you can do for your health. Most adults should get at least 150 minutes of moderate-intensity exercise (any activity that increases your heart rate and causes you to sweat) each week. In addition, most adults need muscle-strengthening exercises on 2 or more days a week.   Maintain a healthy weight. The body mass index (BMI) is a screening tool to identify possible weight problems. It provides an estimate of body fat based on height and weight. Your health care provider can find your BMI and can help you achieve or maintain a healthy weight. For males 20 years and older:  A BMI below 18.5 is considered underweight.  A BMI of 18.5 to 24.9 is normal.  A BMI of 25 to 29.9 is considered overweight.  A BMI of 30 and above is considered obese.  Maintain normal blood lipids and cholesterol by exercising and minimizing your intake of saturated fat. Eat a balanced diet with plenty of fruits and vegetables. Blood tests for lipids and cholesterol should begin at age 44 and be repeated every 5 years. If your lipid or cholesterol levels are high, you are over age 37, or you are at high risk for heart disease, you may need your cholesterol levels checked more frequently.Ongoing high lipid and cholesterol levels should be treated with medicines if diet and exercise are not working.  If you smoke, find out from  your health care provider how to quit. If you do not use tobacco, do not start.  Lung cancer screening is recommended for adults aged 72-80 years who are at high risk for developing lung cancer because of a history of smoking. A yearly low-dose CT scan of the lungs is recommended for people who have at least a 30-pack-year history of smoking and are current smokers or have quit within the past 15 years. A pack year of smoking is smoking an average of 1 pack of cigarettes a day for 1 year (for example, a 30-pack-year history of smoking could mean smoking 1 pack a day for 30 years or 2 packs a day for 15 years). Yearly screening should continue until the smoker has stopped smoking for at least 15 years. Yearly screening should be stopped for people who develop a health problem that would prevent them from having lung cancer treatment.  If you choose to drink alcohol, do not have more than 2 drinks per day. One drink is considered to be 12 oz (360 mL) of beer, 5 oz (150 mL) of wine, or 1.5 oz (45 mL) of liquor.  Avoid the use of street drugs. Do not share needles with anyone. Ask for help if you need support or instructions about stopping the use of drugs.  High blood pressure causes heart disease and increases the risk of stroke. High blood pressure is more likely to develop in:  People who have blood pressure in the end of the normal range (100-139/85-89  mm Hg).  People who are overweight or obese.  People who are African American.  If you are 50-73 years of age, have your blood pressure checked every 3-5 years. If you are 19 years of age or older, have your blood pressure checked every year. You should have your blood pressure measured twice-once when you are at a hospital or clinic, and once when you are not at a hospital or clinic. Record the average of the two measurements. To check your blood pressure when you are not at a hospital or clinic, you can use:  An automated blood pressure machine at  a pharmacy.  A home blood pressure monitor.  If you are 2-1 years old, ask your health care provider if you should take aspirin to prevent heart disease.  Diabetes screening involves taking a blood sample to check your fasting blood sugar level. This should be done once every 3 years after age 72 if you are at a normal weight and without risk factors for diabetes. Testing should be considered at a younger age or be carried out more frequently if you are overweight and have at least 1 risk factor for diabetes.  Colorectal cancer can be detected and often prevented. Most routine colorectal cancer screening begins at the age of 63 and continues through age 51. However, your health care provider may recommend screening at an earlier age if you have risk factors for colon cancer. On a yearly basis, your health care provider may provide home test kits to check for hidden blood in the stool. A small camera at the end of a tube may be used to directly examine the colon (sigmoidoscopy or colonoscopy) to detect the earliest forms of colorectal cancer. Talk to your health care provider about this at age 38 when routine screening begins. A direct exam of the colon should be repeated every 5-10 years through age 56, unless early forms of precancerous polyps or small growths are found.  People who are at an increased risk for hepatitis B should be screened for this virus. You are considered at high risk for hepatitis B if:  You were born in a country where hepatitis B occurs often. Talk with your health care provider about which countries are considered high risk.  Your parents were born in a high-risk country and you have not received a shot to protect against hepatitis B (hepatitis B vaccine).  You have HIV or AIDS.  You use needles to inject street drugs.  You live with, or have sex with, someone who has hepatitis B.  You are a man who has sex with other men (MSM).  You get hemodialysis  treatment.  You take certain medicines for conditions like cancer, organ transplantation, and autoimmune conditions.  Hepatitis C blood testing is recommended for all people born from [birth year] through 1967 and any individual with known risk factors for hepatitis C.  Healthy men should no longer receive prostate-specific antigen (PSA) blood tests as part of routine cancer screening. Talk to your health care provider about prostate cancer screening.  Testicular cancer screening is not recommended for adolescents or adult males who have no symptoms. Screening includes self-exam, a health care provider exam, and other screening tests. Consult with your health care provider about any symptoms you have or any concerns you have about testicular cancer.  Practice safe sex. Use condoms and avoid high-risk sexual practices to reduce the spread of sexually transmitted infections (STIs).  You should be screened for STIs, including gonorrhea  and chlamydia if:  You are sexually active and are younger than 24 years.  You are older than 24 years, and your health care provider tells you that you are at risk for this type of infection.  Your sexual activity has changed since you were last screened, and you are at an increased risk for chlamydia or gonorrhea. Ask your health care provider if you are at risk.  If you are at risk of being infected with HIV, it is recommended that you take a prescription medicine daily to prevent HIV infection. This is called pre-exposure prophylaxis (PrEP). You are considered at risk if:  You are a man who has sex with other men (MSM).  You are a heterosexual man who is sexually active with multiple partners.  You take drugs by injection.  You are sexually active with a partner who has HIV.  Talk with your health care provider about whether you are at high risk of being infected with HIV. If you choose to begin PrEP, you should first be tested for HIV. You should then be tested  every 3 months for as long as you are taking PrEP.  Use sunscreen. Apply sunscreen liberally and repeatedly throughout the day. You should seek shade when your shadow is shorter than you. Protect yourself by wearing long sleeves, pants, a wide-brimmed hat, and sunglasses year round whenever you are outdoors.  Tell your health care provider of new moles or changes in moles, especially if there is a change in shape or color. Also, tell your health care provider if a mole is larger than the size of a pencil eraser.  A one-time screening for abdominal aortic aneurysm (AAA) and surgical repair of large AAAs by ultrasound is recommended for men aged 66-75 years who are current or former smokers.  Stay current with your vaccines (immunizations). This information is not intended to replace advice given to you by your health care provider. Make sure you discuss any questions you have with your health care provider. Document Released: 12/30/2007 Document Revised: 07/24/2014 Document Reviewed: 04/06/2015 Elsevier Interactive Patient Education  2017 [employer].

## 2016-09-11 NOTE — Progress Notes (Signed)
Subjective:    Patient ID: Brett Bradshaw, male    DOB: 01/25/1951, 66 y.o.   MRN: [id number]  CC: Brett Bradshaw is a 66 y.o. male who presents today for physical exam.    HPI: Feeling well. No complaints. No hesistancy, reduced urine stream.    Colorectal  Cancer Screening: UTD ; abstracted, no results to view. Due every 5 years.  Prostate Cancer Screening: Has been discussed with patient; patient would like to follow PSA based on risks versus benefits.  Lung Cancer Screening: No 30 year pack year history and > 55 years.  Immunizations       Tetanus - utd        Pneumococcal - Candidate for.  Hepatitis C screening - Candidate for Labs: Screening labs today. Exercise: Gets regular exercise.  Alcohol use:  occasional Smoking/tobacco use: Nonsmoker.  Regular dental exams: UTD Wears seat belt: Yes. Skin: No new skin lesions, follows with derm annually.   HISTORY:  Past Medical History:  Diagnosis Date  . Chicken pox   . Glaucoma    Bilateral eyes  . Sarcoidosis ([place]) 1996    History reviewed. No pertinent surgical history. Family History  Problem Relation Age of Onset  . Cancer Paternal Uncle     Prostate  . Diabetes Brother     Type I      ALLERGIES: Patient has no allergy information on record.  Current Outpatient Prescriptions on File Prior to Visit  Medication Sig Dispense Refill  . atorvastatin (LIPITOR) 20 MG tablet TAKE 1 TABLET ONCE DAILY AT 6 PM. 90 tablet 3  . latanoprost (XALATAN) 0.005 % ophthalmic solution      No current facility-administered medications on file prior to visit.     Social History  Substance Use Topics  . Smoking status: Never Smoker  . Smokeless tobacco: Never Used  . Alcohol use 0.0 oz/week     Comment: Ocassionally     Review of Systems  Constitutional: Negative for chills and fever.  HENT: Negative for congestion.   Respiratory: Negative for cough.   Cardiovascular: Negative for chest pain, palpitations and leg  swelling.  Gastrointestinal: Negative for diarrhea, nausea and vomiting.  Genitourinary: Negative for decreased urine volume, difficulty urinating and penile pain.  Musculoskeletal: Negative for myalgias.  Skin: Negative for rash.  Neurological: Negative for headaches.  Hematological: Negative for adenopathy.  Psychiatric/Behavioral: Negative for confusion.      Objective:    BP (!) 142/88 (BP Location: Left Arm, Patient Position: Sitting, Cuff Size: Normal)   Pulse 65   Temp 98.4 F (36.9 C) (Oral)   Ht 6' 0.25" (1.835 m)   Wt 206 lb 6.4 oz (93.6 kg)   SpO2 99%   BMI 27.80 kg/m   BP Readings from Last 3 Encounters:  09/11/16 (!) 142/88  05/16/16 138/82  09/09/15 122/72   Wt Readings from Last 3 Encounters:  09/11/16 206 lb 6.4 oz (93.6 kg)  05/16/16 208 lb 6.4 oz (94.5 kg)  09/09/15 209 lb (94.8 kg)    Physical Exam  Constitutional: He appears well-developed and well-nourished.  Neck: No thyroid mass and no thyromegaly present.  Cardiovascular: Regular rhythm and normal heart sounds.   Pulmonary/Chest: Effort normal and breath sounds normal. No respiratory distress. He has no wheezes. He has no rhonchi. He has no rales.  Genitourinary: Prostate is not enlarged and not tender.  Genitourinary Comments: Prostate exam performed. No asymmetry of lobe, masses, or bogginess appreciated. Nontender.  Lymphadenopathy:  Head (right side): No submental, no submandibular, no tonsillar, no preauricular, no posterior auricular and no occipital adenopathy present.       Head (left side): No submental, no submandibular, no tonsillar, no preauricular, no posterior auricular and no occipital adenopathy present.    He has no cervical adenopathy.    He has no axillary adenopathy.  Neurological: He is alert.  Skin: Skin is warm and dry.  Psychiatric: He has a normal mood and affect. His speech is normal and behavior is normal.  Vitals reviewed.      Assessment & Plan:   Problem  List Items Addressed This Visit      Other   On statin therapy - Primary   Relevant Medications   pneumococcal 13-valent conjugate vaccine (PREVNAR 13) injection 0.5 mL (Start on 09/11/2016  8:45 AM)   Other Relevant Orders   CBC with Differential/Platelet   Comprehensive metabolic panel   Hemoglobin A1c   Lipid panel   TSH   VITAMIN D 25 Hydroxy (Vit-D Deficiency, Fractures)   PSA   Hepatitis C antibody   Routine general medical examination at a health care facility    UTD colonoscopy- will bring report so I can review. Prostate exam performed. Prevnar given. Labs including PSA, HepC consented for. Encouraged exercise.       Relevant Medications   pneumococcal 13-valent conjugate vaccine (PREVNAR 13) injection 0.5 mL (Start on 09/11/2016  8:45 AM)   Other Relevant Orders   CBC with Differential/Platelet   Comprehensive metabolic panel   Hemoglobin A1c   Lipid panel   TSH   VITAMIN D 25 Hydroxy (Vit-D Deficiency, Fractures)   PSA   Hepatitis C antibody    Other Visit Diagnoses    Screening for prostate cancer       Relevant Orders   PSA   Encounter for vitamin deficiency screening       Relevant Orders   VITAMIN D 25 Hydroxy (Vit-D Deficiency, Fractures)       I am having [name] maintain his latanoprost and atorvastatin. We will continue to administer pneumococcal 13-valent conjugate vaccine.   Meds ordered this encounter  Medications  . pneumococcal 13-valent conjugate vaccine (PREVNAR 13) injection 0.5 mL    Return precautions given.   Risks, benefits, and alternatives of the medications and treatment plan prescribed today were discussed, and patient expressed understanding.   Education regarding symptom management and diagnosis given to patient on AVS.   Continue to follow with Mable Paris, FNP for routine health maintenance.   [name] and I agreed with plan.   Mable Paris, FNP

## 2016-09-11 NOTE — Assessment & Plan Note (Signed)
UTD colonoscopy- will bring report so I can review. Prostate exam performed. Prevnar given. Labs including PSA, HepC consented for. Encouraged exercise.

## 2016-09-11 NOTE — Progress Notes (Signed)
Pre visit review using our clinic review tool, if applicable. No additional management support is needed unless otherwise documented below in the visit note. 

## 2016-09-12 ENCOUNTER — Other Ambulatory Visit: Payer: Self-pay | Admitting: Family

## 2016-09-12 DIAGNOSIS — R972 Elevated prostate specific antigen [PSA]: Secondary | ICD-10-CM

## 2016-09-12 LAB — HEPATITIS C ANTIBODY: HCV Ab: NEGATIVE

## 2016-09-13 ENCOUNTER — Encounter: Payer: Self-pay | Admitting: *Deleted

## 2016-09-19 ENCOUNTER — Ambulatory Visit (INDEPENDENT_AMBULATORY_CARE_PROVIDER_SITE_OTHER): Payer: Medicare Other | Admitting: Urology

## 2016-09-19 ENCOUNTER — Encounter: Payer: Self-pay | Admitting: Urology

## 2016-09-19 VITALS — BP 148/79 | HR 68 | Ht 74.0 in | Wt 208.1 lb

## 2016-09-19 DIAGNOSIS — N138 Other obstructive and reflux uropathy: Secondary | ICD-10-CM | POA: Diagnosis not present

## 2016-09-19 DIAGNOSIS — N401 Enlarged prostate with lower urinary tract symptoms: Secondary | ICD-10-CM | POA: Diagnosis not present

## 2016-09-19 DIAGNOSIS — N529 Male erectile dysfunction, unspecified: Secondary | ICD-10-CM | POA: Diagnosis not present

## 2016-09-19 DIAGNOSIS — R972 Elevated prostate specific antigen [PSA]: Secondary | ICD-10-CM | POA: Diagnosis not present

## 2016-09-19 NOTE — Progress Notes (Signed)
09/19/2016 1:48 PM   Brett Bradshaw 1951/03/14 [id number]  Referring provider: Burnard Hawthorne, FNP [address] [place], [place] [address]  Chief Complaint  Patient presents with  . New Patient (Initial Visit)    elevated psa referred by Mable Paris NP    HPI: Patient is a 66 year old Caucasian male who is referred to [place] by his PCP, Burnard Hawthorne, FNP for an elevated PSA of 4.55.  Patient was found to have a PSA of 4.55 ng/mL on 09/11/2016 during a routine exam.  There are no previous PSA's for comparison.    His IPSS score today is 1, which is mild lower urinary tract symptomatology.  He is pleased with his quality life due to his urinary symptoms.   His major complaint today is intermittent nocturia x 1.  He has had these symptoms for several years.  He denies any dysuria, hematuria or suprapubic pain.   His has had an elevated PSA in the past.  He states that he was told it was due to an infection and placed on antibiotics and the value went down.    He also denies any recent fevers, chills, nausea or vomiting.  His paternal uncle with fatal prostate cancer at age 41.       IPSS    Row Name 09/19/16 1300         International Prostate Symptom Score   How often have you had the sensation of not emptying your bladder? Not at All     How often have you had to urinate less than every two hours? Not at All     How often have you found you stopped and started again several times when you urinated? Not at All     How often have you found it difficult to postpone urination? Not at All     How often have you had a weak urinary stream? Not at All     How often have you had to strain to start urination? Not at All     How many times did you typically get up at night to urinate? 1 Time     Total IPSS Score 1       Quality of Life due to urinary symptoms   If you were to spend the rest of your life with your urinary condition just the way it is now how  would you feel about that? Mostly Satisfied        Score:  1-7 Mild 8-19 Moderate 20-35 Severe  His SHIM score is 16, which is mild to moderate ED.   He has been having difficulty with erections for .   His major complaint is his age.  His libido is preserved.   His risk factors for ED are age, BPH and HLD.  He denies any painful erections or curvatures with his erections.   He is still having spontaneous erections.       SHIM    Row Name 09/19/16 1331         SHIM: Over the last 6 months:   How do you rate your confidence that you could get and keep an erection? Moderate     When you had erections with sexual stimulation, how often were your erections hard enough for penetration (entering your partner)? A Few Times (much less than half the time)     During sexual intercourse, how often were you able to maintain your erection after you  had penetrated (entered) your partner? Difficult     During sexual intercourse, how difficult was it to maintain your erection to completion of intercourse? Slightly Difficult     When you attempted sexual intercourse, how often was it satisfactory for you? Slightly Difficult       SHIM Total Score   SHIM 16        Score: 1-7 Severe ED 8-11 Moderate ED 12-16 Mild-Moderate ED 17-21 Mild ED 22-25 No ED  PMH: Past Medical History:  Diagnosis Date  . Chicken pox   . Glaucoma    Bilateral eyes  . Sarcoidosis ([place]) 1996    Surgical History: Past Surgical History:  Procedure Laterality Date  . pyloric stenosis     newborn    Home Medications:  Allergies as of 09/19/2016   No Known Allergies     Medication List       Accurate as of 09/19/16  1:48 PM. Always use your most recent med list.          atorvastatin 20 MG tablet Commonly known as:  LIPITOR TAKE 1 TABLET ONCE DAILY AT 6 PM.   ibuprofen 200 MG tablet Commonly known as:  ADVIL,MOTRIN Take by mouth.   latanoprost 0.005 % ophthalmic solution Commonly known as:   XALATAN   LUMIGAN 0.01 % Soln Generic drug:  bimatoprost 1 drop nightly.       Allergies: No Known Allergies  Family History: Family History  Problem Relation Age of Onset  . Cancer Paternal Uncle     Prostate  . Diabetes Brother     Type I  . Kidney cancer Neg Hx   . Bladder Cancer Neg Hx     Social History:  reports that he has never smoked. He has never used smokeless tobacco. He reports that he drinks alcohol. He reports that he does not use drugs.  ROS: UROLOGY Frequent Urination?: No Hard to postpone urination?: No Burning/pain with urination?: No Get up at night to urinate?: Yes Leakage of urine?: No Urine stream starts and stops?: No Trouble starting stream?: No Do you have to strain to urinate?: No Blood in urine?: No Urinary tract infection?: No Sexually transmitted disease?: No Injury to kidneys or bladder?: No Painful intercourse?: No Weak stream?: No Erection problems?: No Penile pain?: No  Gastrointestinal Nausea?: No Vomiting?: No Indigestion/heartburn?: No Diarrhea?: No Constipation?: No  Constitutional Fever: No Night sweats?: No Weight loss?: No Fatigue?: No  Skin Skin rash/lesions?: No Itching?: No  Eyes Blurred vision?: No Double vision?: No  Ears/Nose/Throat Sore throat?: No Sinus problems?: No  Hematologic/Lymphatic Swollen glands?: No Easy bruising?: No  Cardiovascular Leg swelling?: No Chest pain?: No  Respiratory Cough?: No Shortness of breath?: No  Endocrine Excessive thirst?: No  Musculoskeletal Back pain?: No Joint pain?: No  Neurological Headaches?: No Dizziness?: No  Psychologic Depression?: No Anxiety?: No  Physical Exam: BP (!) 148/79   Pulse 68   Ht 6\' 2"  (1.88 m)   Wt 208 lb 1.6 oz (94.4 kg)   BMI 26.72 kg/m   Constitutional: Well nourished. Alert and oriented, No acute distress. HEENT: [place] AT, moist mucus membranes. Trachea midline, no masses. Cardiovascular: No clubbing,  cyanosis, or edema. Respiratory: Normal respiratory effort, no increased work of breathing. GI: Abdomen is soft, non tender, non distended, no abdominal masses. Liver and spleen not palpable.  No hernias appreciated.  Stool sample for occult testing is not indicated.   GU: No CVA tenderness.  No bladder fullness or  masses.  Patient with circumcised phallus.  Urethral meatus is patent.  No penile discharge. No penile lesions or rashes. Scrotum without lesions, cysts, rashes and/or edema.  Testicles are located scrotally bilaterally. No masses are appreciated in the testicles. Left and right epididymis are normal. Rectal: Patient with  normal sphincter tone. Anus and perineum without scarring or rashes. No rectal masses are appreciated. Prostate is approximately 45 grams, irregular, no nodules are appreciated. Seminal vesicles are normal. Skin: No rashes, bruises or suspicious lesions. Lymph: No cervical or inguinal adenopathy. Neurologic: Grossly intact, no focal deficits, moving all 4 extremities. Psychiatric: Normal mood and affect.  Laboratory Data: Lab Results  Component Value Date   WBC 5.6 09/11/2016   HGB 16.2 09/11/2016   HCT 46.2 09/11/2016   MCV 92.0 09/11/2016   PLT 109.0 (L) 09/11/2016    Lab Results  Component Value Date   CREATININE 1.00 09/11/2016    Lab Results  Component Value Date   PSA 4.55 (H) 09/11/2016    Lab Results  Component Value Date   HGBA1C 5.1 09/11/2016    Lab Results  Component Value Date   TSH 1.36 09/11/2016       Component Value Date/Time   CHOL 160 09/11/2016 0831   HDL 50.60 09/11/2016 0831   CHOLHDL 3 09/11/2016 0831   VLDL 22.0 09/11/2016 0831   LDLCALC 88 09/11/2016 0831    Lab Results  Component Value Date   AST 17 09/11/2016   Lab Results  Component Value Date   ALT 17 09/11/2016     Assessment & Plan:    1. Elevated PSA  - will repeat PSA today and add a free and total PSA to the blood work if it still returns  above 4  - RTC pending PSA results  2. BPH with LUTS  - IPSS score is 1/2  - Continue conservative management, avoiding bladder irritants and timed voiding's  - RTC pending PSA  3. Erectile dysfunction  - SHIM score is 16  - Patient does not desire treatment at this time  Return for pending PSA results.  These notes generated with voice recognition software. I apologize for typographical errors.  Zara Council, [facility] [address], [place], [place] [address] [phone]

## 2016-09-20 ENCOUNTER — Telehealth: Payer: Self-pay

## 2016-09-20 LAB — PSA: PROSTATE SPECIFIC AG, SERUM: 4.9 ng/mL — AB (ref 0.0–4.0)

## 2016-09-20 NOTE — Telephone Encounter (Signed)
Labs were added.

## 2016-09-20 NOTE — Telephone Encounter (Signed)
-----   Message from Nori Riis, PA-C sent at 09/20/2016  8:06 AM EST ----- Would you add a free and total PSA to his blood work?

## 2016-09-21 ENCOUNTER — Telehealth: Payer: Self-pay

## 2016-09-21 DIAGNOSIS — R972 Elevated prostate specific antigen [PSA]: Secondary | ICD-10-CM

## 2016-09-21 LAB — PSA, TOTAL AND FREE
PROSTATE SPECIFIC AG, SERUM: 4.7 ng/mL — AB (ref 0.0–4.0)
PSA, Free Pct: 15.5 %
PSA, Free: 0.73 ng/mL

## 2016-09-21 LAB — SPECIMEN STATUS REPORT

## 2016-09-21 NOTE — Telephone Encounter (Signed)
Spoke with pt in reference to PSA results and possibility of having prostate cancer. Pt elected to monitor PSA. Pt requested to come back in 3 months instead of 6. 61mo lab appt and orders placed.

## 2016-09-21 NOTE — Telephone Encounter (Signed)
-----   Message from Nori Riis, PA-C sent at 09/21/2016  7:51 AM EST ----- Please notify the patient that his PSA remains slightly elevated at 4.7.  His free and total PSA notes a 23% probability of having prostate cancer.  He may choose to undergo a biopsy at this time, but I feel it reasonable to repeat the PSA and prostate exam in 6 months.

## 2016-10-23 DIAGNOSIS — H2513 Age-related nuclear cataract, bilateral: Secondary | ICD-10-CM | POA: Diagnosis not present

## 2016-10-23 DIAGNOSIS — H401131 Primary open-angle glaucoma, bilateral, mild stage: Secondary | ICD-10-CM | POA: Diagnosis not present

## 2016-10-23 DIAGNOSIS — H524 Presbyopia: Secondary | ICD-10-CM | POA: Diagnosis not present

## 2016-12-12 ENCOUNTER — Other Ambulatory Visit: Payer: Medicare Other

## 2016-12-12 DIAGNOSIS — R972 Elevated prostate specific antigen [PSA]: Secondary | ICD-10-CM

## 2016-12-13 ENCOUNTER — Telehealth: Payer: Self-pay

## 2016-12-13 LAB — PSA: Prostate Specific Ag, Serum: 5.2 ng/mL — ABNORMAL HIGH (ref 0.0–4.0)

## 2016-12-13 NOTE — Telephone Encounter (Signed)
-----   Message from Nori Riis, PA-C sent at 12/13/2016  7:41 AM EDT ----- Would you please add a free and total PSA to his blood work?

## 2016-12-13 NOTE — Telephone Encounter (Signed)
Labs were added.

## 2016-12-14 ENCOUNTER — Telehealth: Payer: Self-pay

## 2016-12-14 DIAGNOSIS — R972 Elevated prostate specific antigen [PSA]: Secondary | ICD-10-CM

## 2016-12-14 NOTE — Telephone Encounter (Signed)
-----   Message from Nori Riis, PA-C sent at 12/14/2016  8:20 AM EDT ----- Please let the patient know that his PSA has remained basically unchanged.  I would like to see him in 3 months for a repeat PSA and exam.

## 2016-12-14 NOTE — Telephone Encounter (Signed)
Spoke with pt in reference to PSA results. Pt stated he is uncomfortable with his free and total PSA being slightly elevated since last time. Pt requested to have a total PSA drawn in 30mo. Please advise.

## 2016-12-14 NOTE — Telephone Encounter (Signed)
That will be fine to do a free and total PSA in 1 month.

## 2016-12-15 NOTE — Telephone Encounter (Signed)
Patient notified and scheduled for a PSA in 1 month, orders placed

## 2016-12-15 NOTE — Addendum Note (Signed)
Addended by: Tommy Rainwater on: 12/15/2016 12:07 PM   Modules accepted: Orders

## 2016-12-25 DIAGNOSIS — S92411A Displaced fracture of proximal phalanx of right great toe, initial encounter for closed fracture: Secondary | ICD-10-CM | POA: Diagnosis not present

## 2016-12-26 DIAGNOSIS — S9781XS Crushing injury of right foot, sequela: Secondary | ICD-10-CM | POA: Diagnosis not present

## 2016-12-26 DIAGNOSIS — S92411A Displaced fracture of proximal phalanx of right great toe, initial encounter for closed fracture: Secondary | ICD-10-CM | POA: Diagnosis not present

## 2016-12-26 DIAGNOSIS — S92919A Unspecified fracture of unspecified toe(s), initial encounter for closed fracture: Secondary | ICD-10-CM | POA: Insufficient documentation

## 2016-12-26 DIAGNOSIS — S9780XA Crushing injury of unspecified foot, initial encounter: Secondary | ICD-10-CM | POA: Insufficient documentation

## 2017-01-04 LAB — SPECIMEN STATUS REPORT

## 2017-01-04 LAB — PSA, TOTAL AND FREE
PROSTATE SPECIFIC AG, SERUM: 5 ng/mL — AB (ref 0.0–4.0)
PSA FREE: 0.85 ng/mL
PSA, Free Pct: 17 %

## 2017-01-08 ENCOUNTER — Other Ambulatory Visit: Payer: Self-pay

## 2017-01-08 ENCOUNTER — Other Ambulatory Visit: Payer: Medicare Other

## 2017-01-08 DIAGNOSIS — R972 Elevated prostate specific antigen [PSA]: Secondary | ICD-10-CM

## 2017-01-09 ENCOUNTER — Telehealth: Payer: Self-pay

## 2017-01-09 LAB — FPSA% REFLEX
% FREE PSA: 20.7 %
PSA, FREE: 1.12 ng/mL

## 2017-01-09 LAB — PSA TOTAL (REFLEX TO FREE): Prostate Specific Ag, Serum: 5.4 ng/mL — ABNORMAL HIGH (ref 0.0–4.0)

## 2017-01-09 NOTE — Telephone Encounter (Signed)
-----   Message from Nori Riis, PA-C sent at 01/09/2017  7:45 AM EDT ----- Please notify Brett Bradshaw that his PSA is now 5.4 and the free and total PSA indicate a 20% probability of having prostate cancer at this time.   I will need to see him in August with a PSA prior to the visit.  We have a new blood test available to help [place] screen persons for high grade prostate cancer called the 4K score.  We can discuss that further at his visit in August or he can have it drawn now.

## 2017-01-09 NOTE — Telephone Encounter (Signed)
Patient notified he states that due to insurance he will need to transfer his care to [facility]. He will come by the office to sign a release form

## 2017-01-12 DIAGNOSIS — H40023 Open angle with borderline findings, high risk, bilateral: Secondary | ICD-10-CM | POA: Diagnosis not present

## 2017-03-20 DIAGNOSIS — R972 Elevated prostate specific antigen [PSA]: Secondary | ICD-10-CM | POA: Diagnosis not present

## 2017-03-22 DIAGNOSIS — R972 Elevated prostate specific antigen [PSA]: Secondary | ICD-10-CM | POA: Diagnosis not present

## 2017-05-03 ENCOUNTER — Other Ambulatory Visit: Payer: Self-pay | Admitting: Family

## 2017-05-03 DIAGNOSIS — Z79899 Other long term (current) drug therapy: Secondary | ICD-10-CM

## 2017-05-16 DIAGNOSIS — L814 Other melanin hyperpigmentation: Secondary | ICD-10-CM | POA: Diagnosis not present

## 2017-05-16 DIAGNOSIS — Z85828 Personal history of other malignant neoplasm of skin: Secondary | ICD-10-CM | POA: Diagnosis not present

## 2017-05-16 DIAGNOSIS — L304 Erythema intertrigo: Secondary | ICD-10-CM | POA: Diagnosis not present

## 2017-05-16 DIAGNOSIS — L821 Other seborrheic keratosis: Secondary | ICD-10-CM | POA: Diagnosis not present

## 2017-05-16 DIAGNOSIS — D225 Melanocytic nevi of trunk: Secondary | ICD-10-CM | POA: Diagnosis not present

## 2017-05-16 DIAGNOSIS — D2270 Melanocytic nevi of unspecified lower limb, including hip: Secondary | ICD-10-CM | POA: Diagnosis not present

## 2017-05-16 DIAGNOSIS — L570 Actinic keratosis: Secondary | ICD-10-CM | POA: Diagnosis not present

## 2017-05-16 DIAGNOSIS — D1801 Hemangioma of skin and subcutaneous tissue: Secondary | ICD-10-CM | POA: Diagnosis not present

## 2017-05-16 DIAGNOSIS — D2260 Melanocytic nevi of unspecified upper limb, including shoulder: Secondary | ICD-10-CM | POA: Diagnosis not present

## 2017-07-13 DIAGNOSIS — H40023 Open angle with borderline findings, high risk, bilateral: Secondary | ICD-10-CM | POA: Diagnosis not present

## 2017-09-05 ENCOUNTER — Ambulatory Visit (INDEPENDENT_AMBULATORY_CARE_PROVIDER_SITE_OTHER): Payer: Medicare Other

## 2017-09-05 ENCOUNTER — Other Ambulatory Visit: Payer: Self-pay | Admitting: Family

## 2017-09-05 VITALS — BP 132/70 | HR 68 | Temp 98.6°F | Resp 14 | Ht 73.0 in | Wt 206.4 lb

## 2017-09-05 DIAGNOSIS — Z0000 Encounter for general adult medical examination without abnormal findings: Secondary | ICD-10-CM | POA: Diagnosis not present

## 2017-09-05 DIAGNOSIS — Z23 Encounter for immunization: Secondary | ICD-10-CM | POA: Diagnosis not present

## 2017-09-05 DIAGNOSIS — Z125 Encounter for screening for malignant neoplasm of prostate: Secondary | ICD-10-CM | POA: Diagnosis not present

## 2017-09-05 LAB — HEMOGLOBIN A1C: HEMOGLOBIN A1C: 5.3 % (ref 4.6–6.5)

## 2017-09-05 LAB — LIPID PANEL
Cholesterol: 159 mg/dL (ref 0–200)
HDL: 44 mg/dL (ref >39.00)
LDL Cholesterol: 93 mg/dL (ref 0–99)
NonHDL: 115.49
Total CHOL/HDL Ratio: 4
Triglycerides: 111 mg/dL (ref 0.0–149.0)
VLDL: 22.2 mg/dL (ref 0.0–40.0)

## 2017-09-05 LAB — COMPREHENSIVE METABOLIC PANEL
ALK PHOS: 48 U/L (ref 39–117)
ALT: 21 U/L (ref 0–53)
AST: 16 U/L (ref 0–37)
Albumin: 4.5 g/dL (ref 3.5–5.2)
BILIRUBIN TOTAL: 1.2 mg/dL (ref 0.2–1.2)
BUN: 19 mg/dL (ref 6–23)
CO2: 32 mEq/L (ref 19–32)
Calcium: 9.5 mg/dL (ref 8.4–10.5)
Chloride: 105 mEq/L (ref 96–112)
Creatinine, Ser: 1.06 mg/dL (ref 0.40–1.50)
GFR: 73.67 mL/min (ref >60.00)
GLUCOSE: 119 mg/dL — AB (ref 70–99)
Potassium: 4.1 mEq/L (ref 3.5–5.1)
SODIUM: 141 meq/L (ref 135–145)
TOTAL PROTEIN: 7.1 g/dL (ref 6.0–8.3)

## 2017-09-05 LAB — CBC WITH DIFFERENTIAL/PLATELET
BASOS ABS: 0 10*3/uL (ref 0.0–0.1)
Basophils Relative: 0.2 % (ref 0.0–3.0)
Eosinophils Absolute: 0.1 10*3/uL (ref 0.0–0.7)
Eosinophils Relative: 1.3 % (ref 0.0–5.0)
HCT: 48.5 % (ref 39.0–52.0)
Hemoglobin: 16.8 g/dL (ref 13.0–17.0)
LYMPHS ABS: 1.5 10*3/uL (ref 0.7–4.0)
Lymphocytes Relative: 26.7 % (ref 12.0–46.0)
MCHC: 34.7 g/dL (ref 30.0–36.0)
MCV: 91.8 fl (ref 78.0–100.0)
MONO ABS: 0.4 10*3/uL (ref 0.1–1.0)
Monocytes Relative: 7.3 % (ref 3.0–12.0)
NEUTROS PCT: 64.5 % (ref 43.0–77.0)
Neutro Abs: 3.6 10*3/uL (ref 1.4–7.7)
PLATELETS: 115 10*3/uL — AB (ref 150.0–400.0)
RBC: 5.28 Mil/uL (ref 4.22–5.81)
RDW: 13.9 % (ref 11.5–15.5)
WBC: 5.6 10*3/uL (ref 4.0–10.5)

## 2017-09-05 LAB — TSH: TSH: 1.06 u[IU]/mL (ref 0.35–4.50)

## 2017-09-05 LAB — VITAMIN D 25 HYDROXY (VIT D DEFICIENCY, FRACTURES): VITD: 38.73 ng/mL (ref 30.00–100.00)

## 2017-09-05 LAB — PSA: PSA: 4.34 ng/mL — ABNORMAL HIGH (ref 0.10–4.00)

## 2017-09-05 NOTE — Progress Notes (Addendum)
Subjective:   Brett Bradshaw is a 67 y.o. male who presents for an Initial Medicare Annual Wellness Visit.  Review of Systems    No ROS.  Medicare Wellness Visit. Additional risk factors are reflected in the social history.  Cardiac Risk Factors include: advanced age (>52men, >52 women);male gender     Objective:    Today's Vitals   09/05/17 0933  BP: 132/70  Pulse: 68  Resp: 14  Temp: 98.6 F (37 C)  TempSrc: Oral  SpO2: 98%  Weight: 206 lb 6.4 oz (93.6 kg)  Height: 6\' 1"  (1.854 m)   Body mass index is 27.23 kg/m.  Advanced Directives 09/05/2017  Does Patient Have a Medical Advance Directive? No  Would patient like information on creating a medical advance directive? No - Patient declined    Current Medications (verified) Outpatient Encounter Medications as of 09/05/2017  Medication Sig  . atorvastatin (LIPITOR) 20 MG tablet TAKE 1 TABLET DAILY AT 6 P.M.  . bimatoprost (LUMIGAN) 0.01 % SOLN 1 drop nightly.   . [DISCONTINUED] ibuprofen (ADVIL,MOTRIN) 200 MG tablet Take by mouth.  . [DISCONTINUED] latanoprost (XALATAN) 0.005 % ophthalmic solution    No facility-administered encounter medications on file as of 09/05/2017.     Allergies (verified) Patient has no known allergies.   History: Past Medical History:  Diagnosis Date  . Chicken pox   . Glaucoma    Bilateral eyes  . Sarcoidosis 1996   Past Surgical History:  Procedure Laterality Date  . pyloric stenosis     newborn   Family History  Problem Relation Age of Onset  . Cancer Paternal Uncle        Prostate  . Diabetes Brother        Type I  . COPD Father   . Kidney cancer Neg Hx   . Bladder Cancer Neg Hx    Social History   Socioeconomic History  . Marital status: Married    Spouse name: None  . Number of children: None  . Years of education: None  . Highest education level: None  Social Needs  . Financial resource strain: None  . Food insecurity - worry: Never true  . Food insecurity  - inability: Never true  . Transportation needs - medical: No  . Transportation needs - non-medical: No  Occupational History  . None  Tobacco Use  . Smoking status: Never Smoker  . Smokeless tobacco: Never Used  Substance and Sexual Activity  . Alcohol use: Yes    Alcohol/week: 0.0 oz    Comment: Ocassionally   . Drug use: No  . Sexual activity: Yes    Partners: Female    Comment: Wife  Other Topics Concern  . None  Social History Narrative   Works as a Chief of Staff ,    Lives with wife, Therapist, sports.    Children- 2    Pets: none currently    Caffeine- No soda, 1 cup of coffee daily    Enjoys keeping up with financial information     Tobacco Counseling Counseling given: Not Answered   Clinical Intake:  Pre-visit preparation completed: Yes  Pain : No/denies pain     Diabetes: No  How often do you need to have someone help you when you read instructions, pamphlets, or other written materials from your doctor or pharmacy?: 1 - Never  Interpreter Needed?: No      Activities of Daily Living In your present state of health, do you have any difficulty performing  the following activities: 09/05/2017  Hearing? N  Vision? N  Difficulty concentrating or making decisions? N  Walking or climbing stairs? N  Dressing or bathing? N  Doing errands, shopping? N  Preparing Food and eating ? N  Using the Toilet? N  In the past six months, have you accidently leaked urine? N  Do you have problems with loss of bowel control? N  Managing your Medications? N  Managing your Finances? N  Housekeeping or managing your Housekeeping? N  Some recent data might be hidden     Immunizations and Health Maintenance Immunization History  Administered Date(s) Administered  . Influenza, High Dose Seasonal PF 09/05/2017  . Influenza,inj,Quad PF,6+ Mos 09/09/2015  . Pneumococcal Conjugate-13 09/11/2016  . Td 09/09/2015   There are no preventive care reminders to display for this  patient.  Patient Care Team: Burnard Hawthorne, FNP as PCP - General (Family Medicine)  Indicate any recent Medical Services you may have received from other than [facility] providers in the past year (date may be approximate).     Assessment:   This is a routine wellness examination for Brett Bradshaw.  The goal of the wellness visit is to assist the patient how to close the gaps in care and create a preventative care plan for the patient.   The roster of all physicians providing medical care to patient is listed in the Snapshot section of the chart.  Osteoporosis risk reviewed.    Safety issues reviewed; Smoke and carbon monoxide detectors in the home. No firearms or firearms locked in a safe within the home. Wears seatbelts when driving or riding with others. No violence in the home.  They do not have excessive sun exposure.  Discussed the need for sun protection: hats, long sleeves and the use of sunscreen if there is significant sun exposure.  Patient is alert, normal appearance, oriented to person/place/and time.  Correctly identified the president of the [place] and recalls of 3/3 words. Performs simple calculations and can read correct time from watch face.  Displays appropriate judgement.  No new identified risk were noted.  No failures at ADL's or IADL's.    BMI- discussed the importance of a healthy diet, water intake and the benefits of aerobic exercise. Educational material provided.   24 hour diet recall: Low sodium, heart healthy  Daily fluid intake: 2 cups of caffeine, 4-6 cups of water  Dental- every 6 months.  Sleep patterns- Sleeps 6-8 hours at night.  Wakes feeling rested.   Influenza high dose administered L deltoid, tolerated well. Educational material provided.  Fasting labs complete.  Health maintenance gaps- closed.  Patient Concerns: None at this time. Follow up with PCP as needed.  Hearing/Vision screen Hearing Screening Comments: Patient is able to  hear conversational tones without difficulty.  No issues reported.   Vision Screening Comments: Followed [facility] Wears corrective lenses  Visits every 6 months Glaucoma, drops in use Visual acuity not assessed per patient preference since they have regular follow up with the ophthalmologist  Dietary issues and exercise activities discussed: Current Exercise Habits: Home exercise routine, Type of exercise: stretching, Time (Minutes): 30, Frequency (Times/Week): 2, Weekly Exercise (Minutes/Week): 60, Intensity: Mild  Goals    . Maintain Healthy Lifestyle      Depression Screen PHQ 2/9 Scores 09/05/2017 09/11/2016 05/16/2016 09/09/2015  PHQ - 2 Score 0 0 0 0    Fall Risk Fall Risk  09/05/2017 09/11/2016 05/16/2016 09/09/2015  Falls in the past year?  No No No No   Cognitive Function: MMSE - Mini Mental State Exam 09/05/2017  Orientation to time 5  Orientation to Place 5  Registration 3  Attention/ Calculation 5  Recall 3  Language- name 2 objects 2  Language- repeat 1  Language- follow 3 step command 3  Language- read & follow direction 1  Write a sentence 1  Copy design 1  Total score 30        Screening Tests Health Maintenance  Topic Date Due  . PNA vac Low Risk Adult (2 of 2 - PPSV23) 09/11/2017  . COLONOSCOPY  11/01/2020  . TETANUS/TDAP  09/08/2025  . INFLUENZA VACCINE  Completed  . Hepatitis C Screening  Completed     Plan:   End of life planning; Advanced aging; Advanced directives discussed.  No HCPOA/Living Will.  Additional information declined at this time.  I have personally reviewed and noted the following in the patient's chart:   . Medical and social history . Use of alcohol, tobacco or illicit drugs  . Current medications and supplements . Functional ability and status . Nutritional status . Physical activity . Advanced directives . List of other physicians . Hospitalizations, surgeries, and ER visits in previous 12  months . Vitals . Screenings to include cognitive, depression, and falls . Referrals and appointments  In addition, I have reviewed and discussed with patient certain preventive protocols, quality metrics, and best practice recommendations. A written personalized care plan for preventive services as well as general preventive health recommendations were provided to patient.     Varney Biles, LPN   08/20/5595    Agree with plan. Mable Paris, NP

## 2017-09-05 NOTE — Patient Instructions (Addendum)
  Mr. Ackerley , Thank you for taking time to come for your Medicare Wellness Visit. I appreciate your ongoing commitment to your health goals. Please review the following plan we discussed and let me know if I can assist you in the future.   Follow up with Mable Paris FNP as needed.    Have a great day!  These are the goals we discussed: Goals    . Maintain Healthy Lifestyle       This is a list of the screening recommended for you and due dates:  Health Maintenance  Topic Date Due  . Pneumonia vaccines (2 of 2 - PPSV23) 09/11/2017  . Colon Cancer Screening  11/01/2020  . Tetanus Vaccine  09/08/2025  . Flu Shot  Completed  .  Hepatitis C: One time screening is recommended by Center for Disease Control  (CDC) for  adults born from [birth year] through 1967.   Completed

## 2017-09-14 DIAGNOSIS — H40023 Open angle with borderline findings, high risk, bilateral: Secondary | ICD-10-CM | POA: Diagnosis not present

## 2017-09-14 DIAGNOSIS — H40053 Ocular hypertension, bilateral: Secondary | ICD-10-CM | POA: Diagnosis not present

## 2017-09-19 ENCOUNTER — Ambulatory Visit (INDEPENDENT_AMBULATORY_CARE_PROVIDER_SITE_OTHER): Payer: Medicare Other | Admitting: Family

## 2017-09-19 ENCOUNTER — Encounter: Payer: Self-pay | Admitting: Family

## 2017-09-19 VITALS — BP 140/82 | HR 70 | Temp 98.5°F | Resp 15 | Ht 73.0 in | Wt 208.5 lb

## 2017-09-19 DIAGNOSIS — R972 Elevated prostate specific antigen [PSA]: Secondary | ICD-10-CM | POA: Insufficient documentation

## 2017-09-19 DIAGNOSIS — Z0001 Encounter for general adult medical examination with abnormal findings: Secondary | ICD-10-CM

## 2017-09-19 DIAGNOSIS — Z0000 Encounter for general adult medical examination without abnormal findings: Secondary | ICD-10-CM

## 2017-09-19 DIAGNOSIS — Z79899 Other long term (current) drug therapy: Secondary | ICD-10-CM | POA: Diagnosis not present

## 2017-09-19 DIAGNOSIS — Z23 Encounter for immunization: Secondary | ICD-10-CM | POA: Diagnosis not present

## 2017-09-19 NOTE — Addendum Note (Signed)
Addended by: Johna Sheriff on: 09/19/2017 01:25 PM   Modules accepted: Orders

## 2017-09-19 NOTE — Assessment & Plan Note (Addendum)
Exam still supports symmetrically enlarged prostate.  PSA remains elevated.Discussed with patient the importance of following up with urology which he was in agreement with.

## 2017-09-19 NOTE — Patient Instructions (Signed)
Monitor blood pressure since slightly elevated today,  Goal is less than 130/80; if persistently higher, please make sooner follow up appointment so we can recheck you blood pressure and manage medications  Follow up with Urology as we discussed  . Health Maintenance, Male A healthy lifestyle and preventive care is important for your health and wellness. Ask your health care provider about what schedule of regular examinations is right for you. What should I know about weight and diet? Eat a Healthy Diet  Eat plenty of vegetables, fruits, whole grains, low-fat dairy products, and lean protein.  Do not eat a lot of foods high in solid fats, added sugars, or salt.  Maintain a Healthy Weight Regular exercise can help you achieve or maintain a healthy weight. You should:  Do at least 150 minutes of exercise each week. The exercise should increase your heart rate and make you sweat (moderate-intensity exercise).  Do strength-training exercises at least twice a week.  Watch Your Levels of Cholesterol and Blood Lipids  Have your blood tested for lipids and cholesterol every 5 years starting at 69 years of age. If you are at high risk for heart disease, you should start having your blood tested when you are 69 years old. You may need to have your cholesterol levels checked more often if: ? Your lipid or cholesterol levels are high. ? You are older than 69 years of age. ? You are at high risk for heart disease.  What should I know about cancer screening? Many types of cancers can be detected early and may often be prevented. Lung Cancer  You should be screened every year for lung cancer if: ? You are a current smoker who has smoked for at least 30 years. ? You are a former smoker who has quit within the past 15 years.  Talk to your health care provider about your screening options, when you should start screening, and how often you should be screened.  Colorectal Cancer  Routine  colorectal cancer screening usually begins at 69 years of age and should be repeated every 5-10 years until you are 69 years old. You may need to be screened more often if early forms of precancerous polyps or small growths are found. Your health care provider may recommend screening at an earlier age if you have risk factors for colon cancer.  Your health care provider may recommend using home test kits to check for hidden blood in the stool.  A small camera at the end of a tube can be used to examine your colon (sigmoidoscopy or colonoscopy). This checks for the earliest forms of colorectal cancer.  Prostate and Testicular Cancer  Depending on your age and overall health, your health care provider may do certain tests to screen for prostate and testicular cancer.  Talk to your health care provider about any symptoms or concerns you have about testicular or prostate cancer.  Skin Cancer  Check your skin from head to toe regularly.  Tell your health care provider about any new moles or changes in moles, especially if: ? There is a change in a mole's size, shape, or color. ? You have a mole that is larger than a pencil eraser.  Always use sunscreen. Apply sunscreen liberally and repeat throughout the day.  Protect yourself by wearing long sleeves, pants, a wide-brimmed hat, and sunglasses when outside.  What should I know about heart disease, diabetes, and high blood pressure?  If you are 58-65 years of age, have  your blood pressure checked every 3-5 years. If you are 40 years of age or older, have your blood pressure checked every year. You should have your blood pressure measured twice-once when you are at a hospital or clinic, and once when you are not at a hospital or clinic. Record the average of the two measurements. To check your blood pressure when you are not at a hospital or clinic, you can use: ? An automated blood pressure machine at a pharmacy. ? A home blood pressure  monitor.  Talk to your health care provider about your target blood pressure.  If you are between 45-79 years old, ask your health care provider if you should take aspirin to prevent heart disease.  Have regular diabetes screenings by checking your fasting blood sugar level. ? If you are at a normal weight and have a low risk for diabetes, have this test once every three years after the age of 45. ? If you are overweight and have a high risk for diabetes, consider being tested at a younger age or more often.  A one-time screening for abdominal aortic aneurysm (AAA) by ultrasound is recommended for men aged 65-75 years who are current or former smokers. What should I know about preventing infection? Hepatitis B If you have a higher risk for hepatitis B, you should be screened for this virus. Talk with your health care provider to find out if you are at risk for hepatitis B infection. Hepatitis C Blood testing is recommended for:  Everyone born from 1945 through 1965.  Anyone with known risk factors for hepatitis C.  Sexually Transmitted Diseases (STDs)  You should be screened each year for STDs including gonorrhea and chlamydia if: ? You are sexually active and are younger than 69 years of age. ? You are older than 69 years of age and your health care provider tells you that you are at risk for this type of infection. ? Your sexual activity has changed since you were last screened and you are at an increased risk for chlamydia or gonorrhea. Ask your health care provider if you are at risk.  Talk with your health care provider about whether you are at high risk of being infected with HIV. Your health care provider may recommend a prescription medicine to help prevent HIV infection.  What else can I do?  Schedule regular health, dental, and eye exams.  Stay current with your vaccines (immunizations).  Do not use any tobacco products, such as cigarettes, chewing tobacco, and  e-cigarettes. If you need help quitting, ask your health care provider.  Limit alcohol intake to no more than 2 drinks per day. One drink equals 12 ounces of beer, 5 ounces of wine, or 1 ounces of hard liquor.  Do not use street drugs.  Do not share needles.  Ask your health care provider for help if you need support or information about quitting drugs.  Tell your health care provider if you often feel depressed.  Tell your health care provider if you have ever been abused or do not feel safe at home. This information is not intended to replace advice given to you by your health care provider. Make sure you discuss any questions you have with your health care provider. Document Released: 12/30/2007 Document Revised: 03/01/2016 Document Reviewed: 04/06/2015 Elsevier Interactive Patient Education  2018 Elsevier Inc.  

## 2017-09-19 NOTE — Progress Notes (Addendum)
Subjective:    Patient ID: Brett Bradshaw, male    DOB: Jul 03, 1951, 67 y.o.   MRN: [id number]  CC: Brett Bradshaw is a 67 y.o. male who presents today for physical exam.    HPI: HLD- compliant with medication    Urology 03/2017- elevated PSA. Prostate Cancer Screening:elevated psa; he additional studies done with urology last year however no biopsy.  He denies any dysuria, decreased stream, difficulty urinating.  Colorectal  Cancer Screening: UTD 2017, abstracted, per patient  Normal. On 5 year repeat    Lung Cancer Screening: No 30 year pack year history and > 55 years.         Tetanus - utd        Pneumococcal - Candidate for pneumo 23  Labs: Screening labs done prior Exercise: Gets regular exercise.  Alcohol use: occasional Smoking/tobacco use: Nonsmoker.  Regular dental exams: UTD Wears seat belt: Yes. Skin: follows with dermatology, Dr Lauralee Evener  HISTORY:  Past Medical History:  Diagnosis Date  . Chicken pox   . Glaucoma    Bilateral eyes  . Sarcoidosis 1996    Past Surgical History:  Procedure Laterality Date  . pyloric stenosis     newborn   Family History  Problem Relation Age of Onset  . Cancer Paternal Uncle        Prostate  . Diabetes Brother        Type I  . COPD Father   . Kidney cancer Neg Hx   . Bladder Cancer Neg Hx       ALLERGIES: Patient has no known allergies.  Current Outpatient Medications on File Prior to Visit  Medication Sig Dispense Refill  . atorvastatin (LIPITOR) 20 MG tablet TAKE 1 TABLET DAILY AT 6 P.M. 90 tablet 1  . bimatoprost (LUMIGAN) 0.01 % SOLN 1 drop nightly.      No current facility-administered medications on file prior to visit.     Social History   Tobacco Use  . Smoking status: Never Smoker  . Smokeless tobacco: Never Used  Substance Use Topics  . Alcohol use: Yes    Alcohol/week: 0.0 oz    Comment: Ocassionally   . Drug use: No    Review of Systems  Constitutional: Negative for chills and fever.   HENT: Negative for congestion.   Respiratory: Negative for cough.   Cardiovascular: Negative for chest pain, palpitations and leg swelling.  Gastrointestinal: Negative for diarrhea, nausea and vomiting.  Genitourinary: Negative for difficulty urinating and dysuria.  Musculoskeletal: Negative for myalgias.  Skin: Negative for rash.  Neurological: Negative for headaches.  Hematological: Negative for adenopathy.  Psychiatric/Behavioral: Negative for confusion.      Objective:    BP 140/82   Pulse 70   Temp 98.5 F (36.9 C) (Oral)   Resp 15   Ht 6\' 1"  (1.854 m)   Wt 208 lb 8 oz (94.6 kg)   SpO2 98%   BMI 27.51 kg/m   BP Readings from Last 3 Encounters:  09/19/17 140/82  09/05/17 132/70  09/19/16 (!) 148/79   Wt Readings from Last 3 Encounters:  09/19/17 208 lb 8 oz (94.6 kg)  09/05/17 206 lb 6.4 oz (93.6 kg)  09/19/16 208 lb 1.6 oz (94.4 kg)    Physical Exam  Constitutional: He appears well-developed and well-nourished.  Neck: No thyroid mass and no thyromegaly present.  Cardiovascular: Regular rhythm and normal heart sounds.  Pulmonary/Chest: Effort normal and breath sounds normal. No respiratory distress. He has no wheezes.  He has no rhonchi. He has no rales.  Genitourinary: Prostate is enlarged. Prostate is not tender.  Genitourinary Comments: Prostate exam performed.  Surgically enlarged .no masses, or bogginess appreciated. Nontender.  Lymphadenopathy:       Head (right side): No submental, no submandibular, no tonsillar, no preauricular, no posterior auricular and no occipital adenopathy present.       Head (left side): No submental, no submandibular, no tonsillar, no preauricular, no posterior auricular and no occipital adenopathy present.    He has no cervical adenopathy.    He has no axillary adenopathy.  Neurological: He is alert.  Skin: Skin is warm and dry.  Psychiatric: He has a normal mood and affect. His speech is normal and behavior is normal.  Vitals  reviewed.      Assessment & Plan:   Problem List Items Addressed This Visit      Other   On statin therapy    Compliant.  Continue medication      Routine general medical examination at a health care facility - Primary    Up-to-date colonoscopy.  Encourage exercise.  Labs done prior note reviewed with patient in room today.      Elevated PSA    Exam still supports symmetrically enlarged prostate.  PSA remains elevated.Discussed with patient the importance of following up with urology which he was in agreement with.        Relevant Orders   Ambulatory referral to Urology    Other Visit Diagnoses    Need for pneumococcal vaccination       Relevant Orders   Pneumococcal polysaccharide vaccine 23-valent greater than or equal to 2yo subcutaneous/IM (Completed)       I am having Brett Bradshaw maintain his bimatoprost and atorvastatin.   No orders of the defined types were placed in this encounter.   Return precautions given.   Risks, benefits, and alternatives of the medications and treatment plan prescribed today were discussed, and patient expressed understanding.   Education regarding symptom management and diagnosis given to patient on AVS.   Continue to follow with Burnard Hawthorne, FNP for routine health maintenance.   Brett Bradshaw and I agreed with plan.   Mable Paris, FNP

## 2017-09-19 NOTE — Assessment & Plan Note (Signed)
Compliant.  Continue medication

## 2017-09-19 NOTE — Assessment & Plan Note (Signed)
Up-to-date colonoscopy.  Encourage exercise.  Labs done prior note reviewed with patient in room today.

## 2017-09-20 ENCOUNTER — Ambulatory Visit: Payer: Medicare Other | Admitting: Family

## 2017-09-26 NOTE — Addendum Note (Signed)
Addended by: Burnard Hawthorne on: 09/26/2017 09:48 AM   Modules accepted: Level of Service

## 2017-10-16 ENCOUNTER — Encounter: Payer: Self-pay | Admitting: Family

## 2017-10-17 ENCOUNTER — Encounter: Payer: Self-pay | Admitting: Family

## 2017-10-17 ENCOUNTER — Telehealth: Payer: Self-pay | Admitting: Family

## 2017-10-17 DIAGNOSIS — I159 Secondary hypertension, unspecified: Secondary | ICD-10-CM

## 2017-10-17 MED ORDER — AMLODIPINE BESYLATE 2.5 MG PO TABS: 2.5000 mg | ORAL_TABLET | Freq: Every day | ORAL | 3 refills | Status: DC

## 2017-10-17 NOTE — Telephone Encounter (Signed)
Call pt  See mychart message re: BP . It is very high  I sent in rx.   Ensure he picks up and read my message

## 2017-10-17 NOTE — Telephone Encounter (Signed)
Left voice mail to call back ok for [facility] to speak to patient 

## 2017-10-18 NOTE — Telephone Encounter (Signed)
Spoke with patient he will call back with blood pressure readings wants to give blood pressure time to begin working

## 2017-10-19 ENCOUNTER — Encounter: Payer: Self-pay | Admitting: Family

## 2017-10-22 NOTE — Telephone Encounter (Signed)
Mychart message sent.

## 2017-10-23 ENCOUNTER — Other Ambulatory Visit: Payer: Self-pay | Admitting: Family

## 2017-10-24 ENCOUNTER — Other Ambulatory Visit: Payer: Self-pay | Admitting: Family

## 2017-10-24 DIAGNOSIS — I159 Secondary hypertension, unspecified: Secondary | ICD-10-CM

## 2017-10-24 MED ORDER — AMLODIPINE BESYLATE 5 MG PO TABS: 5.0000 mg | ORAL_TABLET | Freq: Every day | ORAL | 1 refills | Status: DC

## 2017-10-30 ENCOUNTER — Other Ambulatory Visit: Payer: Self-pay | Admitting: Family

## 2017-10-30 DIAGNOSIS — Z79899 Other long term (current) drug therapy: Secondary | ICD-10-CM

## 2017-11-14 ENCOUNTER — Encounter: Payer: Self-pay | Admitting: Family

## 2017-11-14 ENCOUNTER — Ambulatory Visit (INDEPENDENT_AMBULATORY_CARE_PROVIDER_SITE_OTHER): Payer: Medicare Other | Admitting: Family

## 2017-11-14 VITALS — BP 138/82 | HR 61 | Temp 98.9°F | Wt 209.5 lb

## 2017-11-14 DIAGNOSIS — I10 Essential (primary) hypertension: Secondary | ICD-10-CM | POA: Diagnosis not present

## 2017-11-14 MED ORDER — LISINOPRIL 5 MG PO TABS: 5.0000 mg | ORAL_TABLET | Freq: Every day | ORAL | 3 refills | Status: DC

## 2017-11-14 NOTE — Patient Instructions (Signed)
Start lisinopril 5mg .   Labs for electrolytes on Monday of next week  Monitor blood pressure,  Goal is less than 130/80; if persistently higher, please make sooner follow up appointment so we can recheck you blood pressure and manage medications    Managing Your Hypertension Hypertension is commonly called high blood pressure. This is when the force of your blood pressing against the walls of your arteries is too strong. Arteries are blood vessels that carry blood from your heart throughout your body. Hypertension forces the heart to work harder to pump blood, and may cause the arteries to become narrow or stiff. Having untreated or uncontrolled hypertension can cause heart attack, stroke, kidney disease, and other problems. What are blood pressure readings? A blood pressure reading consists of a higher number over a lower number. Ideally, your blood pressure should be below 120/80. The first ("top") number is called the systolic pressure. It is a measure of the pressure in your arteries as your heart beats. The second ("bottom") number is called the diastolic pressure. It is a measure of the pressure in your arteries as the heart relaxes. What does my blood pressure reading mean? Blood pressure is classified into four stages. Based on your blood pressure reading, your health care provider may use the following stages to determine what type of treatment you need, if any. Systolic pressure and diastolic pressure are measured in a unit called mm Hg. Normal  Systolic pressure: below 124.  Diastolic pressure: below 80. Elevated  Systolic pressure: 580-998.  Diastolic pressure: below 80. Hypertension stage 1  Systolic pressure: 338-250.  Diastolic pressure: 53-97. Hypertension stage 2  Systolic pressure: 673 or above.  Diastolic pressure: 90 or above. What health risks are associated with hypertension? Managing your hypertension is an important responsibility. Uncontrolled hypertension  can lead to:  A heart attack.  A stroke.  A weakened blood vessel (aneurysm).  Heart failure.  Kidney damage.  Eye damage.  Metabolic syndrome.  Memory and concentration problems.  What changes can I make to manage my hypertension? Hypertension can be managed by making lifestyle changes and possibly by taking medicines. Your health care provider will help you make a plan to bring your blood pressure within a normal range. Eating and drinking  Eat a diet that is high in fiber and potassium, and low in salt (sodium), added sugar, and fat. An example eating plan is called the DASH (Dietary Approaches to Stop Hypertension) diet. To eat this way: ? Eat plenty of fresh fruits and vegetables. Try to fill half of your plate at each meal with fruits and vegetables. ? Eat whole grains, such as whole wheat pasta, brown rice, or whole grain bread. Fill about one quarter of your plate with whole grains. ? Eat low-fat diary products. ? Avoid fatty cuts of meat, processed or cured meats, and poultry with skin. Fill about one quarter of your plate with lean proteins such as fish, chicken without skin, beans, eggs, and tofu. ? Avoid premade and processed foods. These tend to be higher in sodium, added sugar, and fat.  Reduce your daily sodium intake. Most people with hypertension should eat less than 1,500 mg of sodium a day.  Limit alcohol intake to no more than 1 drink a day for nonpregnant women and 2 drinks a day for men. One drink equals 12 oz of beer, 5 oz of wine, or 1 oz of hard liquor. Lifestyle  Work with your health care provider to maintain a healthy body  weight, or to lose weight. Ask what an ideal weight is for you.  Get at least 30 minutes of exercise that causes your heart to beat faster (aerobic exercise) most days of the week. Activities may include walking, swimming, or biking.  Include exercise to strengthen your muscles (resistance exercise), such as weight lifting, as part  of your weekly exercise routine. Try to do these types of exercises for 30 minutes at least 3 days a week.  Do not use any products that contain nicotine or tobacco, such as cigarettes and e-cigarettes. If you need help quitting, ask your health care provider.  Control any long-term (chronic) conditions you have, such as high cholesterol or diabetes. Monitoring  Monitor your blood pressure at home as told by your health care provider. Your personal target blood pressure may vary depending on your medical conditions, your age, and other factors.  Have your blood pressure checked regularly, as often as told by your health care provider. Working with your health care provider  Review all the medicines you take with your health care provider because there may be side effects or interactions.  Talk with your health care provider about your diet, exercise habits, and other lifestyle factors that may be contributing to hypertension.  Visit your health care provider regularly. Your health care provider can help you create and adjust your plan for managing hypertension. Will I need medicine to control my blood pressure? Your health care provider may prescribe medicine if lifestyle changes are not enough to get your blood pressure under control, and if:  Your systolic blood pressure is 130 or higher.  Your diastolic blood pressure is 80 or higher.  Take medicines only as told by your health care provider. Follow the directions carefully. Blood pressure medicines must be taken as prescribed. The medicine does not work as well when you skip doses. Skipping doses also puts you at risk for problems. Contact a health care provider if:  You think you are having a reaction to medicines you have taken.  You have repeated (recurrent) headaches.  You feel dizzy.  You have swelling in your ankles.  You have trouble with your vision. Get help right away if:  You develop a severe headache or  confusion.  You have unusual weakness or numbness, or you feel faint.  You have severe pain in your chest or abdomen.  You vomit repeatedly.  You have trouble breathing. Summary  Hypertension is when the force of blood pumping through your arteries is too strong. If this condition is not controlled, it may put you at risk for serious complications.  Your personal target blood pressure may vary depending on your medical conditions, your age, and other factors. For most people, a normal blood pressure is less than 120/80.  Hypertension is managed by lifestyle changes, medicines, or both. Lifestyle changes include weight loss, eating a healthy, low-sodium diet, exercising more, and limiting alcohol. This information is not intended to replace advice given to you by your health care provider. Make sure you discuss any questions you have with your health care provider. Document Released: 03/27/2012 Document Revised: 05/31/2016 Document Reviewed: 05/31/2016 Elsevier Interactive Patient Education  [employer].

## 2017-11-14 NOTE — Assessment & Plan Note (Addendum)
Improved however not at goal- particularly when reviewing patient's blood pressure log from home.  Will start low-dose lisinopril.  Patient monitor at home and let me know if at goal.pending bmp 5 days.

## 2017-11-14 NOTE — Progress Notes (Signed)
Subjective:    Patient ID: Brett Bradshaw, male    DOB: [birth year], 67 y.o.   MRN: [id number]  CC: Brett Bradshaw is a 67 y.o. male who presents today for follow up.   HPI: HTN- at home 155/74, 142/77. Improved with increase of amlodipine. Compliant with Amlodipine 5mg   Denies exertional chest pain or pressure, numbness or tingling radiating to left arm or jaw, palpitations, dizziness, frequent headaches, changes in vision, or shortness of breath.     HISTORY:  Past Medical History:  Diagnosis Date  . Chicken pox   . Glaucoma    Bilateral eyes  . Sarcoidosis 1996   Past Surgical History:  Procedure Laterality Date  . pyloric stenosis     newborn   Family History  Problem Relation Age of Onset  . Cancer Paternal Uncle        Prostate  . Diabetes Brother        Type I  . COPD Father   . Kidney cancer Neg Hx   . Bladder Cancer Neg Hx     Allergies: Patient has no known allergies. Current Outpatient Medications on File Prior to Visit  Medication Sig Dispense Refill  . amLODipine (NORVASC) 5 MG tablet Take 1 tablet (5 mg total) by mouth daily. 90 tablet 1  . atorvastatin (LIPITOR) 20 MG tablet TAKE 1 TABLET DAILY AT 6 P.M. (NEEDS TO BE SEEN BY PRIMARY CARE PHYSICIAN TO RECEIVE FURTHER REFILLS) 90 tablet 1  . bimatoprost (LUMIGAN) 0.01 % SOLN 1 drop nightly.      No current facility-administered medications on file prior to visit.     Social History   Tobacco Use  . Smoking status: Never Smoker  . Smokeless tobacco: Never Used  Substance Use Topics  . Alcohol use: Yes    Alcohol/week: 0.0 oz    Comment: Ocassionally   . Drug use: No    Review of Systems  Constitutional: Negative for chills and fever.  Eyes: Negative for visual disturbance.  Respiratory: Negative for cough.   Cardiovascular: Negative for chest pain and palpitations.  Gastrointestinal: Negative for nausea and vomiting.  Neurological: Negative for headaches.      Objective:    BP 138/82  (BP Location: Left Arm, Patient Position: Sitting, Cuff Size: Normal)   Pulse 61   Temp 98.9 F (37.2 C) (Oral)   Wt 209 lb 8 oz (95 kg)   SpO2 98%   BMI 27.64 kg/m  BP Readings from Last 3 Encounters:  11/14/17 138/82  09/19/17 140/82  09/05/17 132/70   Wt Readings from Last 3 Encounters:  11/14/17 209 lb 8 oz (95 kg)  09/19/17 208 lb 8 oz (94.6 kg)  09/05/17 206 lb 6.4 oz (93.6 kg)    Physical Exam  Constitutional: He appears well-developed and well-nourished.  Cardiovascular: Regular rhythm and normal heart sounds.  Pulmonary/Chest: Effort normal and breath sounds normal. No respiratory distress. He has no wheezes. He has no rhonchi. He has no rales.  Lymphadenopathy:       Head (left side): No submandibular and no preauricular adenopathy present.  Neurological: He is alert.  Skin: Skin is warm and dry.  Psychiatric: He has a normal mood and affect. His speech is normal and behavior is normal.  Vitals reviewed.      Assessment & Plan:   Problem List Items Addressed This Visit      Cardiovascular and Mediastinum   Essential hypertension - Primary    Improved however not at goal-  particularly when reviewing patient's blood pressure log from home.  Will start low-dose lisinopril.  Patient monitor at home and let me know if at goal.pending bmp 5 days.       Relevant Medications   lisinopril (PRINIVIL,ZESTRIL) 5 MG tablet       I am having Brett Bradshaw start on lisinopril. I am also having him maintain his bimatoprost, amLODipine, and atorvastatin.   Meds ordered this encounter  Medications  . lisinopril (PRINIVIL,ZESTRIL) 5 MG tablet    Sig: Take 1 tablet (5 mg total) by mouth daily.    Dispense:  90 tablet    Refill:  3    Order Specific Question:   Supervising Provider    Answer:   Crecencio Mc [[id number]]    Return precautions given.   Risks, benefits, and alternatives of the medications and treatment plan prescribed today were discussed, and patient  expressed understanding.   Education regarding symptom management and diagnosis given to patient on AVS.  Continue to follow with Brett Hawthorne, FNP for routine health maintenance.   Brett Bradshaw and I agreed with plan.   Brett Paris, FNP

## 2017-11-16 ENCOUNTER — Telehealth: Payer: Self-pay | Admitting: *Deleted

## 2017-11-16 DIAGNOSIS — I10 Essential (primary) hypertension: Secondary | ICD-10-CM

## 2017-11-16 NOTE — Telephone Encounter (Signed)
Pt has lab appt on Monday. Please place future orders.

## 2017-11-16 NOTE — Telephone Encounter (Signed)
ordered

## 2017-11-19 ENCOUNTER — Other Ambulatory Visit: Payer: Medicare Other

## 2017-11-19 ENCOUNTER — Other Ambulatory Visit (INDEPENDENT_AMBULATORY_CARE_PROVIDER_SITE_OTHER): Payer: Medicare Other

## 2017-11-19 DIAGNOSIS — I10 Essential (primary) hypertension: Secondary | ICD-10-CM | POA: Diagnosis not present

## 2017-11-19 LAB — BASIC METABOLIC PANEL
BUN: 18 mg/dL (ref 6–23)
CHLORIDE: 103 meq/L (ref 96–112)
CO2: 28 mEq/L (ref 19–32)
Calcium: 9.4 mg/dL (ref 8.4–10.5)
Creatinine, Ser: 1.21 mg/dL (ref 0.40–1.50)
GFR: 63.2 mL/min (ref >60.00)
Glucose, Bld: 140 mg/dL — ABNORMAL HIGH (ref 70–99)
POTASSIUM: 4 meq/L (ref 3.5–5.1)
Sodium: 139 mEq/L (ref 135–145)

## 2017-12-24 ENCOUNTER — Encounter: Payer: Self-pay | Admitting: Family

## 2017-12-25 DIAGNOSIS — Z79899 Other long term (current) drug therapy: Secondary | ICD-10-CM | POA: Diagnosis not present

## 2017-12-25 DIAGNOSIS — R972 Elevated prostate specific antigen [PSA]: Secondary | ICD-10-CM | POA: Diagnosis not present

## 2018-01-02 ENCOUNTER — Ambulatory Visit (INDEPENDENT_AMBULATORY_CARE_PROVIDER_SITE_OTHER): Payer: Medicare Other | Admitting: Family

## 2018-01-02 ENCOUNTER — Encounter: Payer: Self-pay | Admitting: Family

## 2018-01-02 VITALS — BP 128/60 | HR 69 | Temp 98.4°F | Wt 211.2 lb

## 2018-01-02 DIAGNOSIS — Z79899 Other long term (current) drug therapy: Secondary | ICD-10-CM | POA: Diagnosis not present

## 2018-01-02 DIAGNOSIS — I10 Essential (primary) hypertension: Secondary | ICD-10-CM | POA: Diagnosis not present

## 2018-01-02 DIAGNOSIS — I159 Secondary hypertension, unspecified: Secondary | ICD-10-CM

## 2018-01-02 DIAGNOSIS — N529 Male erectile dysfunction, unspecified: Secondary | ICD-10-CM | POA: Diagnosis not present

## 2018-01-02 DIAGNOSIS — J309 Allergic rhinitis, unspecified: Secondary | ICD-10-CM | POA: Insufficient documentation

## 2018-01-02 MED ORDER — AMLODIPINE BESYLATE 5 MG PO TABS: 5.0000 mg | ORAL_TABLET | Freq: Every day | ORAL | 1 refills | Status: DC

## 2018-01-02 MED ORDER — FLUTICASONE PROPIONATE 50 MCG/ACT NA SUSP: 2.0000 | Freq: Every day | NASAL | 6 refills | Status: DC

## 2018-01-02 MED ORDER — LORATADINE 10 MG PO TABS: 10.0000 mg | ORAL_TABLET | Freq: Every day | ORAL | 1 refills | Status: DC

## 2018-01-02 MED ORDER — LOSARTAN POTASSIUM 50 MG PO TABS: 50.0000 mg | ORAL_TABLET | Freq: Every day | ORAL | 3 refills | Status: DC

## 2018-01-02 MED ORDER — ATORVASTATIN CALCIUM 20 MG PO TABS: ORAL_TABLET | ORAL | 1 refills | Status: DC

## 2018-01-02 MED ORDER — SILDENAFIL CITRATE 25 MG PO TABS: 25.0000 mg | ORAL_TABLET | Freq: Every day | ORAL | 1 refills | Status: DC | PRN

## 2018-01-02 NOTE — Assessment & Plan Note (Signed)
Still slightly elevated at home.  Since we have concern regarding cough from ACE inhibitor, will go ahead and discontinue lisinopril, start losartan today.  Patient monitor blood pressure at home and let me know if not at goal.

## 2018-01-02 NOTE — Assessment & Plan Note (Addendum)
Benign exam.  Based on chronicity of symptoms, suspect allergic  We will go ahead and start Claritin.  Advised patient to use Flonase daily.  If no improvement, we will consult allergist, ENT.  We also discussed discussed possibly trial of Singulair.  Patient will let me know

## 2018-01-02 NOTE — Progress Notes (Signed)
Subjective:    Patient ID: Brett Bradshaw, male    DOB: [birth year], 67 y.o.   MRN: [id number]  CC: Maleik Bradshaw is a 67 y.o. male who presents today for an acute visit.    HPI: CC: dry cough, post nasal drip x 6 months, waxes and wanes.  Worse this year. Suspect allergies. No fever, chills.  Flonase without relief.  HTN- 5mg  amlodipine. At home blood pressures have been 140s /80s. Denies exertional chest pain or pressure, numbness or tingling radiating to left arm or jaw, palpitations, dizziness, frequent headaches, changes in vision, or shortness of breath.   No epigastric pain, sour taste, burping.   Describes trouble with erection and would like a medication to use. No heavy alcohol use. No trouble urinating, hesitancy.           Elevated PSA- urology 12/25/17 HISTORY:  Past Medical History:  Diagnosis Date  . Chicken pox   . Glaucoma    Bilateral eyes  . Sarcoidosis 1996   Past Surgical History:  Procedure Laterality Date  . pyloric stenosis     newborn   Family History  Problem Relation Age of Onset  . Cancer Paternal Uncle        Prostate  . Diabetes Brother        Type I  . COPD Father   . Kidney cancer Neg Hx   . Bladder Cancer Neg Hx     Allergies: Patient has no known allergies. Current Outpatient Medications on File Prior to Visit  Medication Sig Dispense Refill  . amLODipine (NORVASC) 5 MG tablet Take 1 tablet (5 mg total) by mouth daily. 90 tablet 1  . atorvastatin (LIPITOR) 20 MG tablet TAKE 1 TABLET DAILY AT 6 P.M. (NEEDS TO BE SEEN BY PRIMARY CARE PHYSICIAN TO RECEIVE FURTHER REFILLS) 90 tablet 1  . bimatoprost (LUMIGAN) 0.01 % SOLN 1 drop nightly.     Marland Kitchen lisinopril (PRINIVIL,ZESTRIL) 5 MG tablet Take 1 tablet (5 mg total) by mouth daily. 90 tablet 3   No current facility-administered medications on file prior to visit.     Social History   Tobacco Use  . Smoking status: Never Smoker  . Smokeless tobacco: Never Used  Substance Use  Topics  . Alcohol use: Yes    Alcohol/week: 0.0 oz    Comment: Ocassionally   . Drug use: No    Review of Systems  Constitutional: Negative for chills and fever.  HENT: Positive for postnasal drip. Negative for congestion, sinus pressure and sore throat.   Respiratory: Positive for cough. Negative for shortness of breath and wheezing.   Cardiovascular: Negative for chest pain and palpitations.  Gastrointestinal: Negative for nausea and vomiting.  Genitourinary: Negative for difficulty urinating, penile pain, penile swelling and scrotal swelling.      Objective:    BP 128/60 (BP Location: Left Arm, Patient Position: Sitting, Cuff Size: Normal)   Pulse 69   Temp 98.4 F (36.9 C) (Oral)   Wt 211 lb 4 oz (95.8 kg)   SpO2 99%   BMI 27.87 kg/m   BP Readings from Last 3 Encounters:  01/02/18 128/60  11/14/17 138/82  09/19/17 140/82     Physical Exam  Constitutional: Vital signs are normal. He appears well-developed and well-nourished.  HENT:  Head: Normocephalic and atraumatic.  Right Ear: Hearing, tympanic membrane, external ear and ear canal normal. No drainage, swelling or tenderness. Tympanic membrane is not injected, not erythematous and not bulging. No middle ear effusion. No  decreased hearing is noted.  Left Ear: Hearing, tympanic membrane, external ear and ear canal normal. No drainage, swelling or tenderness. Tympanic membrane is not injected, not erythematous and not bulging.  No middle ear effusion. No decreased hearing is noted.  Nose: Nose normal. Right sinus exhibits no maxillary sinus tenderness and no frontal sinus tenderness. Left sinus exhibits no maxillary sinus tenderness and no frontal sinus tenderness.  Mouth/Throat: Uvula is midline, oropharynx is clear and moist and mucous membranes are normal. No oropharyngeal exudate, posterior oropharyngeal edema, posterior oropharyngeal erythema or tonsillar abscesses.  Eyes: Conjunctivae are normal.  Cardiovascular:  Regular rhythm and normal heart sounds.  Pulmonary/Chest: Effort normal and breath sounds normal. No respiratory distress. He has no wheezes. He has no rhonchi. He has no rales.  Lymphadenopathy:       Head (right side): No submental, no submandibular, no tonsillar, no preauricular, no posterior auricular and no occipital adenopathy present.       Head (left side): No submental, no submandibular, no tonsillar, no preauricular, no posterior auricular and no occipital adenopathy present.    He has no cervical adenopathy.  Neurological: He is alert.  Skin: Skin is warm and dry.  Psychiatric: He has a normal mood and affect. His speech is normal and behavior is normal.  Vitals reviewed.      Assessment & Plan:   Problem List Items Addressed This Visit      Cardiovascular and Mediastinum   Essential hypertension    Still slightly elevated at home.  Since we have concern regarding cough from ACE inhibitor, will go ahead and discontinue lisinopril, start losartan today.  Patient monitor blood pressure at home and let me know if not at goal.      Relevant Medications   atorvastatin (LIPITOR) 20 MG tablet   losartan (COZAAR) 50 MG tablet   amLODipine (NORVASC) 5 MG tablet   sildenafil (VIAGRA) 25 MG tablet   Other Relevant Orders   Basic metabolic panel     Respiratory   Allergic rhinitis - Primary    Benign exam.  Based on chronicity of symptoms, suspect allergic  We will go ahead and start Claritin.  Advised patient to use Flonase daily.  If no improvement, we will consult allergist, ENT.  We also discussed discussed possibly trial of Singulair.  Patient will let me know      Relevant Medications   fluticasone (FLONASE) 50 MCG/ACT nasal spray   loratadine (CLARITIN) 10 MG tablet     Genitourinary   Erectile dysfunction    Trial of Viagra given to patient.  Given lifestyle education regarding maintaining weight, abstaining from alcohol use, as also can affect the ability to maintain  erection.  Patient does follow with urology.  Advised if erectile dysfunction persists, recommend follow-up with urology.  Patient verbalized understanding.      Relevant Medications   sildenafil (VIAGRA) 25 MG tablet     Other   On statin therapy   Relevant Medications   atorvastatin (LIPITOR) 20 MG tablet    Other Visit Diagnoses    Secondary hypertension       Relevant Medications   atorvastatin (LIPITOR) 20 MG tablet   losartan (COZAAR) 50 MG tablet   amLODipine (NORVASC) 5 MG tablet   sildenafil (VIAGRA) 25 MG tablet        I am having [name] maintain his bimatoprost, amLODipine, atorvastatin, and lisinopril.   No orders of the defined types were placed in this encounter.  Return precautions given.   Risks, benefits, and alternatives of the medications and treatment plan prescribed today were discussed, and patient expressed understanding.   Education regarding symptom management and diagnosis given to patient on AVS.  Continue to follow with Burnard Hawthorne, FNP for routine health maintenance.   [name] and I agreed with plan.   Mable Paris, FNP

## 2018-01-02 NOTE — Assessment & Plan Note (Signed)
Trial of Viagra given to patient.  Given lifestyle education regarding maintaining weight, abstaining from alcohol use, as also can affect the ability to maintain erection.  Patient does follow with urology.  Advised if erectile dysfunction persists, recommend follow-up with urology.  Patient verbalized understanding.

## 2018-01-02 NOTE — Patient Instructions (Addendum)
Labs on Monday since starting losartan  Monitor blood pressure,  Goal is less than 130/80; if persistently higher, please make sooner follow up appointment so we can recheck you blood pressure and manage medications  Trial of viagra.   Bring blood pressure cuff to next appointment  Trial of flonase, claritin. If no improvement, we could also try singulair , consult with ENT/ allergist.   Let me know how you are doing.

## 2018-01-04 ENCOUNTER — Encounter: Payer: Self-pay | Admitting: Family

## 2018-01-04 DIAGNOSIS — N529 Male erectile dysfunction, unspecified: Secondary | ICD-10-CM

## 2018-01-07 ENCOUNTER — Other Ambulatory Visit (INDEPENDENT_AMBULATORY_CARE_PROVIDER_SITE_OTHER): Payer: Medicare Other

## 2018-01-07 DIAGNOSIS — I10 Essential (primary) hypertension: Secondary | ICD-10-CM

## 2018-01-07 LAB — BASIC METABOLIC PANEL
BUN: 22 mg/dL (ref 6–23)
CO2: 27 mEq/L (ref 19–32)
Calcium: 9.8 mg/dL (ref 8.4–10.5)
Chloride: 103 mEq/L (ref 96–112)
Creatinine, Ser: 1.18 mg/dL (ref 0.40–1.50)
GFR: 65.03 mL/min (ref >60.00)
GLUCOSE: 130 mg/dL — AB (ref 70–99)
Potassium: 4.3 mEq/L (ref 3.5–5.1)
SODIUM: 139 meq/L (ref 135–145)

## 2018-01-07 MED ORDER — SILDENAFIL CITRATE 25 MG PO TABS: 25.0000 mg | ORAL_TABLET | Freq: Every day | ORAL | 1 refills | Status: DC | PRN

## 2018-01-18 DIAGNOSIS — H40023 Open angle with borderline findings, high risk, bilateral: Secondary | ICD-10-CM | POA: Diagnosis not present

## 2018-01-18 DIAGNOSIS — H40053 Ocular hypertension, bilateral: Secondary | ICD-10-CM | POA: Diagnosis not present

## 2018-05-10 ENCOUNTER — Encounter: Payer: Self-pay | Admitting: Family Medicine

## 2018-05-10 ENCOUNTER — Ambulatory Visit (INDEPENDENT_AMBULATORY_CARE_PROVIDER_SITE_OTHER): Payer: Medicare Other | Admitting: Family Medicine

## 2018-05-10 VITALS — BP 134/66 | HR 63 | Temp 98.3°F | Ht 74.0 in | Wt 206.0 lb

## 2018-05-10 DIAGNOSIS — J309 Allergic rhinitis, unspecified: Secondary | ICD-10-CM

## 2018-05-10 DIAGNOSIS — R0982 Postnasal drip: Secondary | ICD-10-CM | POA: Insufficient documentation

## 2018-05-10 DIAGNOSIS — R05 Cough: Secondary | ICD-10-CM

## 2018-05-10 DIAGNOSIS — R059 Cough, unspecified: Secondary | ICD-10-CM | POA: Insufficient documentation

## 2018-05-10 MED ORDER — DM-GUAIFENESIN ER 30-600 MG PO TB12: 1.0000 | ORAL_TABLET | Freq: Two times a day (BID) | ORAL | 0 refills | Status: AC

## 2018-05-10 MED ORDER — LORATADINE-PSEUDOEPHEDRINE ER 10-240 MG PO TB24: 1.0000 | ORAL_TABLET | Freq: Every day | ORAL | 0 refills | Status: AC

## 2018-05-10 MED ORDER — LORATADINE 10 MG PO TABS
10.0000 mg | ORAL_TABLET | Freq: Every day | ORAL | 11 refills | Status: DC
Start: 2018-05-10 — End: 2018-09-25

## 2018-05-10 NOTE — Patient Instructions (Signed)

## 2018-05-10 NOTE — Progress Notes (Signed)
   Subjective:    Patient ID: Brett Bradshaw, male    DOB: [birth year], 67 y.o.   MRN: [id number]  HPI   Patient presents to clinic complaining of chronic postnasal drip, dry cough for the past few weeks.  States he used to use a nasal spray, but recently has not used this.  States he really would like a medication that will help dry up his congestion.  Drainage is white or clear.  Denies fever or chills.  Denies shortness of breath or wheezing.  Patient Active Problem List   Diagnosis Date Noted  . Post-nasal drip 05/10/2018  . Cough 05/10/2018  . Erectile dysfunction 01/02/2018  . Chronic allergic rhinitis 01/02/2018  . Elevated PSA 09/19/2017  . Routine general medical examination at a health care facility 09/14/2015  . Thrombocytopenia ([place]) 01/27/2015  . On statin therapy 01/27/2015  . Essential hypertension 01/27/2015   Social History   Tobacco Use  . Smoking status: Never Smoker  . Smokeless tobacco: Never Used  Substance Use Topics  . Alcohol use: Yes    Alcohol/week: 0.0 standard drinks    Comment: Ocassionally    Review of Systems  Constitutional: Negative for chills, fatigue and fever.  HENT: +congestion, ear pain, sinus drainage and sore throat.   Eyes: Negative.   Respiratory: +cough. Negative for shortness of breath and wheezing.   Cardiovascular: Negative for chest pain, palpitations and leg swelling.  Gastrointestinal: Negative for abdominal pain, diarrhea, nausea and vomiting.  Genitourinary: Negative for dysuria, frequency and urgency.  Musculoskeletal: Negative for arthralgias and myalgias.  Skin: Negative for color change, pallor and rash.  Neurological: Negative for syncope, light-headedness and headaches.  Psychiatric/Behavioral: The patient is not nervous/anxious.       Objective:   Physical Exam  Constitutional: He is oriented to person, place, and time. He appears well-nourished. No distress.  HENT:  Head: Normocephalic and atraumatic.  Nose:  Mucosal edema and rhinorrhea present.  +cobblestoning pattern on back of throat  Eyes: Conjunctivae and EOM are normal. No scleral icterus.  Neck: Neck supple. No tracheal deviation present.  Cardiovascular: Normal rate and regular rhythm.  Pulmonary/Chest: Effort normal and breath sounds normal. No respiratory distress. He has no wheezes. He has no rales.  Lymphadenopathy:    He has no cervical adenopathy.  Neurological: He is alert and oriented to person, place, and time.  Skin: Skin is warm and dry.  Psychiatric: He has a normal mood and affect. His behavior is normal.  Nursing note and vitals reviewed.     Vitals:   05/10/18 1026  BP: 134/66  Pulse: 63  Temp: 98.3 F (36.8 C)  SpO2: 98%   Assessment & Plan:   Chronic allergic rhinitis, postnasal drip, cough - patient will begin Claritin-D once daily for the next 2 weeks, then will transition to regular 10 mg Claritin daily to control chronic allergic rhinitis and postnasal drip.  Lungs are clear on exam, so I suspect that the dry cough is directly related to postnasal drip.  Patient will use Mucinex DM to help thin secretions and calm cough for the next couple of weeks.  Patient advised to increase fluid intake to good handwashing and also do saline nasal washes to help reduce congestion.  Keep regular follow-up with PCP as scheduled.  Return to clinic sooner if issues arise.

## 2018-05-23 ENCOUNTER — Encounter: Payer: Self-pay | Admitting: Family

## 2018-05-27 ENCOUNTER — Other Ambulatory Visit: Payer: Self-pay | Admitting: Family

## 2018-05-27 DIAGNOSIS — N529 Male erectile dysfunction, unspecified: Secondary | ICD-10-CM

## 2018-05-27 MED ORDER — SILDENAFIL CITRATE 25 MG PO TABS: 25.0000 mg | ORAL_TABLET | Freq: Every day | ORAL | 1 refills | Status: DC | PRN

## 2018-06-28 ENCOUNTER — Other Ambulatory Visit: Payer: Self-pay | Admitting: Family

## 2018-06-28 DIAGNOSIS — I159 Secondary hypertension, unspecified: Secondary | ICD-10-CM

## 2018-07-04 ENCOUNTER — Other Ambulatory Visit: Payer: Self-pay | Admitting: Family

## 2018-07-04 DIAGNOSIS — Z79899 Other long term (current) drug therapy: Secondary | ICD-10-CM

## 2018-07-12 DIAGNOSIS — M25421 Effusion, right elbow: Secondary | ICD-10-CM | POA: Diagnosis not present

## 2018-07-12 DIAGNOSIS — D2270 Melanocytic nevi of unspecified lower limb, including hip: Secondary | ICD-10-CM | POA: Diagnosis not present

## 2018-07-12 DIAGNOSIS — D225 Melanocytic nevi of trunk: Secondary | ICD-10-CM | POA: Diagnosis not present

## 2018-07-12 DIAGNOSIS — L304 Erythema intertrigo: Secondary | ICD-10-CM | POA: Diagnosis not present

## 2018-07-12 DIAGNOSIS — Z85828 Personal history of other malignant neoplasm of skin: Secondary | ICD-10-CM | POA: Diagnosis not present

## 2018-07-12 DIAGNOSIS — L821 Other seborrheic keratosis: Secondary | ICD-10-CM | POA: Diagnosis not present

## 2018-07-12 DIAGNOSIS — L814 Other melanin hyperpigmentation: Secondary | ICD-10-CM | POA: Diagnosis not present

## 2018-07-12 DIAGNOSIS — D2260 Melanocytic nevi of unspecified upper limb, including shoulder: Secondary | ICD-10-CM | POA: Diagnosis not present

## 2018-07-12 DIAGNOSIS — D1801 Hemangioma of skin and subcutaneous tissue: Secondary | ICD-10-CM | POA: Diagnosis not present

## 2018-07-12 DIAGNOSIS — L570 Actinic keratosis: Secondary | ICD-10-CM | POA: Diagnosis not present

## 2018-07-26 DIAGNOSIS — H40053 Ocular hypertension, bilateral: Secondary | ICD-10-CM | POA: Diagnosis not present

## 2018-09-06 ENCOUNTER — Ambulatory Visit: Payer: Medicare Other

## 2018-09-25 ENCOUNTER — Ambulatory Visit (INDEPENDENT_AMBULATORY_CARE_PROVIDER_SITE_OTHER): Payer: Medicare Other

## 2018-09-25 ENCOUNTER — Ambulatory Visit (INDEPENDENT_AMBULATORY_CARE_PROVIDER_SITE_OTHER): Payer: Medicare Other | Admitting: Family

## 2018-09-25 ENCOUNTER — Encounter: Payer: Self-pay | Admitting: Family

## 2018-09-25 VITALS — BP 128/70 | HR 61 | Temp 98.2°F | Ht 73.0 in | Wt 207.0 lb

## 2018-09-25 VITALS — BP 128/70 | HR 61 | Temp 98.2°F | Resp 15 | Ht 73.0 in | Wt 207.0 lb

## 2018-09-25 DIAGNOSIS — Z23 Encounter for immunization: Secondary | ICD-10-CM

## 2018-09-25 DIAGNOSIS — Z0000 Encounter for general adult medical examination without abnormal findings: Secondary | ICD-10-CM

## 2018-09-25 DIAGNOSIS — R972 Elevated prostate specific antigen [PSA]: Secondary | ICD-10-CM | POA: Diagnosis not present

## 2018-09-25 DIAGNOSIS — Z125 Encounter for screening for malignant neoplasm of prostate: Secondary | ICD-10-CM | POA: Diagnosis not present

## 2018-09-25 DIAGNOSIS — I10 Essential (primary) hypertension: Secondary | ICD-10-CM | POA: Diagnosis not present

## 2018-09-25 LAB — CBC WITH DIFFERENTIAL/PLATELET
BASOS ABS: 0 10*3/uL (ref 0.0–0.1)
BASOS PCT: 0.2 % (ref 0.0–3.0)
EOS PCT: 0.8 % (ref 0.0–5.0)
Eosinophils Absolute: 0.1 10*3/uL (ref 0.0–0.7)
HEMATOCRIT: 46 % (ref 39.0–52.0)
Hemoglobin: 16.2 g/dL (ref 13.0–17.0)
LYMPHS PCT: 25.7 % (ref 12.0–46.0)
Lymphs Abs: 1.7 10*3/uL (ref 0.7–4.0)
MCHC: 35.2 g/dL (ref 30.0–36.0)
MCV: 92.3 fl (ref 78.0–100.0)
MONOS PCT: 7.6 % (ref 3.0–12.0)
Monocytes Absolute: 0.5 10*3/uL (ref 0.1–1.0)
NEUTROS ABS: 4.4 10*3/uL (ref 1.4–7.7)
Neutrophils Relative %: 65.7 % (ref 43.0–77.0)
PLATELETS: 95 10*3/uL — AB (ref 150.0–400.0)
RBC: 4.98 Mil/uL (ref 4.22–5.81)
RDW: 13.7 % (ref 11.5–15.5)
WBC: 6.7 10*3/uL (ref 4.0–10.5)

## 2018-09-25 LAB — COMPREHENSIVE METABOLIC PANEL
ALBUMIN: 4.6 g/dL (ref 3.5–5.2)
ALK PHOS: 54 U/L (ref 39–117)
ALT: 18 U/L (ref 0–53)
AST: 17 U/L (ref 0–37)
BUN: 18 mg/dL (ref 6–23)
CO2: 29 mEq/L (ref 19–32)
Calcium: 9.3 mg/dL (ref 8.4–10.5)
Chloride: 105 mEq/L (ref 96–112)
Creatinine, Ser: 1.13 mg/dL (ref 0.40–1.50)
GFR: 64.19 mL/min (ref >60.00)
Glucose, Bld: 111 mg/dL — ABNORMAL HIGH (ref 70–99)
POTASSIUM: 4 meq/L (ref 3.5–5.1)
Sodium: 140 mEq/L (ref 135–145)
TOTAL PROTEIN: 6.6 g/dL (ref 6.0–8.3)
Total Bilirubin: 0.9 mg/dL (ref 0.2–1.2)

## 2018-09-25 LAB — LIPID PANEL
Cholesterol: 167 mg/dL (ref 0–200)
HDL: 47.8 mg/dL (ref >39.00)
LDL Cholesterol: 88 mg/dL (ref 0–99)
NonHDL: 119.28
Total CHOL/HDL Ratio: 3
Triglycerides: 155 mg/dL — ABNORMAL HIGH (ref 0.0–149.0)
VLDL: 31 mg/dL (ref 0.0–40.0)

## 2018-09-25 LAB — PSA: PSA: 3.9 ng/mL (ref 0.10–4.00)

## 2018-09-25 NOTE — Patient Instructions (Addendum)
  Brett Bradshaw , Thank you for taking time to come for your Medicare Wellness Visit. I appreciate your ongoing commitment to your health goals. Please review the following plan we discussed and let me know if I can assist you in the future.   These are the goals we discussed: Goals      Patient Stated   . Maintain Healthy Lifestyle (pt-stated)     Stay proactive regarding over all health       This is a list of the screening recommended for you and due dates:  Health Maintenance  Topic Date Due  . Flu Shot  02/14/2018  . Colon Cancer Screening  11/01/2020  . Tetanus Vaccine  09/08/2025  .  Hepatitis C: One time screening is recommended by Center for Disease Control  (CDC) for  adults born from [birth year] through 1967.   Completed  . Pneumonia vaccines  Completed

## 2018-09-25 NOTE — Assessment & Plan Note (Signed)
At goal, continue current regimen 

## 2018-09-25 NOTE — Assessment & Plan Note (Signed)
Prostate exam performed today, pending PSA.  Patient will maintain annual follow-up with urology.

## 2018-09-25 NOTE — Assessment & Plan Note (Signed)
Breast exam performed.  Influenza vaccine given. Note: We discussed relevant and past history of abnormal labs today at length, we decided to order appropriate labs for screening today based on this discussion. Patient was comfortable with this. Patient was advised if any symptoms were to change after today's visit, to notify me as we may always order additional labs.

## 2018-09-25 NOTE — Progress Notes (Signed)
Subjective:   Brett Bradshaw is a 68 y.o. male who presents for Medicare Annual/Subsequent preventive examination.  Review of Systems:  No ROS.  Medicare Wellness Visit. Additional risk factors are reflected in the social history. Cardiac Risk Factors include: advanced age (>72men, >42 women);male gender;hypertension     Objective:    Vitals: BP 128/70 (Patient Position: Sitting, Cuff Size: Normal)   Pulse 61   Temp 98.2 F (36.8 C) (Oral)   Resp 15   Ht 6\' 1"  (1.854 m)   Wt 207 lb (93.9 kg)   SpO2 98%   BMI 27.31 kg/m   Body mass index is 27.31 kg/m.  Advanced Directives 09/25/2018 09/05/2017  Does Patient Have a Medical Advance Directive? No No  Would patient like information on creating a medical advance directive? No - Patient declined No - Patient declined    Tobacco Social History   Tobacco Use  Smoking Status Never Smoker  Smokeless Tobacco Never Used     Counseling given: Not Answered   Clinical Intake:  Pre-visit preparation completed: Yes  Pain : No/denies pain     Diabetes: No  How often do you need to have someone help you when you read instructions, pamphlets, or other written materials from your doctor or pharmacy?: 1 - Never  Interpreter Needed?: No     Past Medical History:  Diagnosis Date  . Chicken pox   . Glaucoma    Bilateral eyes  . Sarcoidosis 1996   Past Surgical History:  Procedure Laterality Date  . pyloric stenosis     newborn   Family History  Problem Relation Age of Onset  . Cancer Paternal Uncle        Prostate  . Diabetes Brother        Type I  . COPD Father   . Kidney cancer Neg Hx   . Bladder Cancer Neg Hx    Social History   Socioeconomic History  . Marital status: Married    Spouse name: Not on file  . Number of children: Not on file  . Years of education: Not on file  . Highest education level: Not on file  Occupational History  . Not on file  Social Needs  . Financial resource strain: Not hard  at all  . Food insecurity:    Worry: Never true    Inability: Never true  . Transportation needs:    Medical: No    Non-medical: No  Tobacco Use  . Smoking status: Never Smoker  . Smokeless tobacco: Never Used  Substance and Sexual Activity  . Alcohol use: Yes    Alcohol/week: 5.0 standard drinks    Types: 5 Standard drinks or equivalent per week    Comment: weekly  . Drug use: No  . Sexual activity: Yes    Partners: Female    Comment: Wife  Lifestyle  . Physical activity:    Days per week: 2 days    Minutes per session: 90 min  . Stress: Only a little  Relationships  . Social connections:    Talks on phone: Not on file    Gets together: Not on file    Attends religious service: Not on file    Active member of club or organization: Not on file    Attends meetings of clubs or organizations: Not on file    Relationship status: Not on file  Other Topics Concern  . Not on file  Social History Narrative   Works as a  medical auditor ,    Lives with wife, Therapist, sports.    Children- 2    Pets: none currently    Caffeine- No soda, 1 cup of coffee daily    Enjoys keeping up with financial information     Outpatient Encounter Medications as of 09/25/2018  Medication Sig  . amLODipine (NORVASC) 5 MG tablet TAKE 1 TABLET DAILY  . atorvastatin (LIPITOR) 20 MG tablet TAKE 1 TABLET DAILY AT 6 P.M. (NEEDS TO BE SEEN BY PRIMARY CARE PHYSICIAN TO RECEIVE FURTHER REFILLS)  . bimatoprost (LUMIGAN) 0.01 % SOLN 1 drop nightly.   Marland Kitchen losartan (COZAAR) 50 MG tablet Take 1 tablet (50 mg total) by mouth daily.  . sildenafil (VIAGRA) 25 MG tablet Take 1 tablet (25 mg total) by mouth daily as needed for erectile dysfunction.   No facility-administered encounter medications on file as of 09/25/2018.     Activities of Daily Living In your present state of health, do you have any difficulty performing the following activities: 09/25/2018  Hearing? N  Vision? N  Difficulty concentrating or making  decisions? N  Walking or climbing stairs? N  Dressing or bathing? N  Doing errands, shopping? N  Preparing Food and eating ? N  Using the Toilet? N  In the past six months, have you accidently leaked urine? N  Do you have problems with loss of bowel control? N  Managing your Medications? N  Managing your Finances? N  Housekeeping or managing your Housekeeping? N  Some recent data might be hidden    Patient Care Team: Burnard Hawthorne, FNP as PCP - General (Family Medicine)   Assessment:   This is a routine wellness examination for Brett Bradshaw.  Health Screenings  Colonoscopy -11/02/15 PSA -09/05/17 Glaucoma -yes Hearing -demonstrates normal hearing during conversation Hemoglobin A1C 09/05/17 Cholesterol -09/05/17 Dental-every 6 months Vision- reports every 3 months  Social  Alcohol intake yes, 4 drinks per week Smoking history- never Smokers in home?none Illicit drug use? none Exercise -weight lifting 3 days weekly, 90 min Diet -regular Sexually Active -yes Multiple Partners -no  Safety  Patient feels safe at home.  Patient does have smoke detectors at home  Patient does wear sunscreen or protective clothing when in direct sunlight  Patient does wear seat belt when driving or riding with others.   Activities of Daily Living Patient can do their own household chores. Denies needing assistance with: driving, feeding themselves, getting from bed to chair, getting to the toilet, bathing/showering, dressing, managing money, climbing flight of stairs, or preparing meals.   Depression Screen Patient denies losing interest in daily life, feeling hopeless, or crying easily over simple problems.   Fall Screen Patient denies being afraid of falling or falling in the last year.   Memory Screen Patient denies problems with memory, misplacing items, and is able to balance checkbook/bank accounts.  Patient is alert, normal appearance, oriented to person/place/and time. Correctly  identified the president of the [place], recall of 3/3 objects, and performing simple calculations.  Patient displays appropriate judgement and can read correct time from watch face.   Immunizations The following Immunizations are up to date: Influenza, shingles, pneumonia, and tetanus.   Other Providers Patient Care Team: Burnard Hawthorne, FNP as PCP - General (Family Medicine)  Exercise Activities and Dietary recommendations Current Exercise Habits: Home exercise routine, Type of exercise: strength training/weights, Time (Minutes): > 60, Intensity: Moderate  Goals      Patient Stated   . Maintain Healthy Lifestyle (pt-stated)  Stay proactive regarding over all health       Fall Risk Fall Risk  09/25/2018 09/05/2017 09/11/2016 05/16/2016 09/09/2015  Falls in the past year? 0 No No No No   Depression Screen PHQ 2/9 Scores 09/25/2018 09/05/2017 09/11/2016 05/16/2016  PHQ - 2 Score 0 0 0 0    Cognitive Function MMSE - Mini Mental State Exam 09/05/2017  Orientation to time 5  Orientation to Place 5  Registration 3  Attention/ Calculation 5  Recall 3  Language- name 2 objects 2  Language- repeat 1  Language- follow 3 step command 3  Language- read & follow direction 1  Write a sentence 1  Copy design 1  Total score 30     6CIT Screen 09/25/2018  What Year? 0 points  What month? 0 points  What time? 0 points  Count back from 20 0 points  Months in reverse 0 points  Repeat phrase 0 points  Total Score 0    Immunization History  Administered Date(s) Administered  . Influenza, High Dose Seasonal PF 09/05/2017, 09/25/2018  . Influenza,inj,Quad PF,6+ Mos 09/09/2015  . Influenza-Unspecified 08/17/2017  . Pneumococcal Conjugate-13 09/11/2016  . Pneumococcal Polysaccharide-23 09/19/2017  . Td 09/09/2015    Qualifies for Shingles Vaccine? yes  Screening Tests Health Maintenance  Topic Date Due  . INFLUENZA VACCINE  02/14/2018  . COLONOSCOPY  11/01/2020  .  TETANUS/TDAP  09/08/2025  . Hepatitis C Screening  Completed  . PNA vac Low Risk Adult  Completed      Plan:    End of life planning; Advanced aging; Advanced directives discussed.  No HCPOA/Living Will.  Additional information declined at this time.  I have personally reviewed and noted the following in the patient's chart:   . Medical and social history . Use of alcohol, tobacco or illicit drugs  . Current medications and supplements . Functional ability and status . Nutritional status . Physical activity . Advanced directives . List of other physicians . Hospitalizations, surgeries, and ER visits in previous 12 months . Vitals . Screenings to include cognitive, depression, and falls . Referrals and appointments  In addition, I have reviewed and discussed with patient certain preventive protocols, quality metrics, and best practice recommendations. A written personalized care plan for preventive services as well as general preventive health recommendations were provided to patient.     Varney Biles, LPN  6/81/2751  Agree with plan. Mable Paris, NP

## 2018-09-25 NOTE — Progress Notes (Signed)
Subjective:    Patient ID: Brett Bradshaw, male    DOB: Apr 20, 1951, 68 y.o.   MRN: [id number]  CC: Brett Bradshaw is a 68 y.o. male who presents today for physical exam.    HPI: Feels well.  No complaints today. HTN- compliant with medication. Denies exertional chest pain or pressure, numbness or tingling radiating to left arm or jaw, palpitations, dizziness, frequent headaches, changes in vision, or shortness of breath.   HLD- doing well on lipitor.   Following with urology, Dr. Carlis Abbott in regards to elevated PSA.    Colorectal  Cancer Screening: UTD Prostate Cancer Screening: Following  PSA. No urinary hesitancy, decreased flow.  Lung Cancer Screening: No 30 year pack year history and > 55 years.  Immunizations       Tetanus - utd        Pneumococcal - Complete  Labs: Screening labs today. Exercise: Gets regular exercise, 2-3 x per week. Weight lifting.   Alcohol use: occasional Smoking/tobacco use: Nonsmoker.  Regular dental exams: UTD Wears seat belt: Yes. Skin: follows with dermatology.   HISTORY:  Past Medical History:  Diagnosis Date  . Chicken pox   . Glaucoma    Bilateral eyes  . Sarcoidosis 1996    Past Surgical History:  Procedure Laterality Date  . pyloric stenosis     newborn   Family History  Problem Relation Age of Onset  . Cancer Paternal Uncle        Prostate  . Diabetes Brother        Type I  . COPD Father   . Kidney cancer Neg Hx   . Bladder Cancer Neg Hx       ALLERGIES: Patient has no known allergies.  Current Outpatient Medications on File Prior to Visit  Medication Sig Dispense Refill  . amLODipine (NORVASC) 5 MG tablet TAKE 1 TABLET DAILY 90 tablet 4  . atorvastatin (LIPITOR) 20 MG tablet TAKE 1 TABLET DAILY AT 6 P.M. (NEEDS TO BE SEEN BY PRIMARY CARE PHYSICIAN TO RECEIVE FURTHER REFILLS) 90 tablet 4  . bimatoprost (LUMIGAN) 0.01 % SOLN 1 drop nightly.     Marland Kitchen losartan (COZAAR) 50 MG tablet Take 1 tablet (50 mg total) by mouth  daily. 90 tablet 3  . sildenafil (VIAGRA) 25 MG tablet Take 1 tablet (25 mg total) by mouth daily as needed for erectile dysfunction. 10 tablet 1   No current facility-administered medications on file prior to visit.     Social History   Tobacco Use  . Smoking status: Never Smoker  . Smokeless tobacco: Never Used  Substance Use Topics  . Alcohol use: Yes    Alcohol/week: 5.0 standard drinks    Types: 5 Standard drinks or equivalent per week    Comment: weekly  . Drug use: No    Review of Systems  Constitutional: Negative for chills and fever.  HENT: Negative for congestion.   Respiratory: Negative for cough.   Cardiovascular: Negative for chest pain, palpitations and leg swelling.  Gastrointestinal: Negative for diarrhea, nausea and vomiting.  Genitourinary: Negative for decreased urine volume and difficulty urinating.  Musculoskeletal: Negative for myalgias.  Skin: Negative for rash.  Neurological: Negative for headaches.  Hematological: Negative for adenopathy.  Psychiatric/Behavioral: Negative for confusion.      Objective:    BP 128/70 (BP Location: Left Arm, Patient Position: Sitting, Cuff Size: Large)   Pulse 61   Temp 98.2 F (36.8 C)   Ht 6\' 1"  (1.854 m)  Wt 207 lb (93.9 kg)   SpO2 98%   BMI 27.31 kg/m   BP Readings from Last 3 Encounters:  09/25/18 128/70  09/25/18 128/70  05/10/18 134/66   Wt Readings from Last 3 Encounters:  09/25/18 207 lb (93.9 kg)  09/25/18 207 lb (93.9 kg)  05/10/18 206 lb (93.4 kg)    Physical Exam Vitals signs reviewed.  Constitutional:      Appearance: He is well-developed.  Neck:     Thyroid: No thyroid mass or thyromegaly.  Cardiovascular:     Rate and Rhythm: Regular rhythm.     Heart sounds: Normal heart sounds.  Pulmonary:     Effort: Pulmonary effort is normal. No respiratory distress.     Breath sounds: Normal breath sounds. No wheezing, rhonchi or rales.  Genitourinary:    Prostate: Not enlarged and not  tender.     Comments: Prostate exam performed. No asymmetry of lobe, masses, or bogginess appreciated. Nontender. Lymphadenopathy:     Head:     Right side of head: No submental, submandibular, tonsillar, preauricular, posterior auricular or occipital adenopathy.     Left side of head: No submental, submandibular, tonsillar, preauricular, posterior auricular or occipital adenopathy.     Cervical: No cervical adenopathy.  Skin:    General: Skin is warm and dry.  Neurological:     Mental Status: He is alert.  Psychiatric:        Speech: Speech normal.        Behavior: Behavior normal.        Assessment & Plan:   Problem List Items Addressed This Visit      Cardiovascular and Mediastinum   Essential hypertension    At goal, continue current regimen        Other   Routine general medical examination at a health care facility - Primary    Breast exam performed.  Influenza vaccine given. Note: We discussed relevant and past history of abnormal labs today at length, we decided to order appropriate labs for screening today based on this discussion. Patient was comfortable with this. Patient was advised if any symptoms were to change after today's visit, to notify me as we may always order additional labs.       Relevant Orders   CBC with Differential/Platelet   Comprehensive metabolic panel   Lipid panel   PSA   Elevated PSA    Prostate exam performed today, pending PSA.  Patient will maintain annual follow-up with urology.       Other Visit Diagnoses    Encounter for immunization       Relevant Orders   Flu vaccine HIGH DOSE PF (Completed)       I have discontinued Wilmot Mathe's fluticasone, loratadine, and loratadine. I am also having him maintain his bimatoprost, losartan, sildenafil, amLODipine, and atorvastatin.   No orders of the defined types were placed in this encounter.   Return precautions given.   Risks, benefits, and alternatives of the medications  and treatment plan prescribed today were discussed, and patient expressed understanding.   Education regarding symptom management and diagnosis given to patient on AVS.   Continue to follow with Burnard Hawthorne, FNP for routine health maintenance.   [name] and I agreed with plan.   Mable Paris, FNP

## 2018-09-25 NOTE — Patient Instructions (Signed)
Nice to see you!   Health Maintenance, Male A healthy lifestyle and preventive care is important for your health and wellness. Ask your health care provider about what schedule of regular examinations is right for you. What should I know about weight and diet? Eat a Healthy Diet  Eat plenty of vegetables, fruits, whole grains, low-fat dairy products, and lean protein.  Do not eat a lot of foods high in solid fats, added sugars, or salt.  Maintain a Healthy Weight Regular exercise can help you achieve or maintain a healthy weight. You should:  Do at least 150 minutes of exercise each week. The exercise should increase your heart rate and make you sweat (moderate-intensity exercise).  Do strength-training exercises at least twice a week. Watch Your Levels of Cholesterol and Blood Lipids  Have your blood tested for lipids and cholesterol every 5 years starting at 70 years of age. If you are at high risk for heart disease, you should start having your blood tested when you are 70 years old. You may need to have your cholesterol levels checked more often if: ? Your lipid or cholesterol levels are high. ? You are older than 70 years of age. ? You are at high risk for heart disease. What should I know about cancer screening? Many types of cancers can be detected early and may often be prevented. Lung Cancer  You should be screened every year for lung cancer if: ? You are a current smoker who has smoked for at least 30 years. ? You are a former smoker who has quit within the past 15 years.  Talk to your health care provider about your screening options, when you should start screening, and how often you should be screened. Colorectal Cancer  Routine colorectal cancer screening usually begins at 70 years of age and should be repeated every 5-10 years until you are 70 years old. You may need to be screened more often if early forms of precancerous polyps or small growths are found. Your  health care provider may recommend screening at an earlier age if you have risk factors for colon cancer.  Your health care provider may recommend using home test kits to check for hidden blood in the stool.  A small camera at the end of a tube can be used to examine your colon (sigmoidoscopy or colonoscopy). This checks for the earliest forms of colorectal cancer. Prostate and Testicular Cancer  Depending on your age and overall health, your health care provider may do certain tests to screen for prostate and testicular cancer.  Talk to your health care provider about any symptoms or concerns you have about testicular or prostate cancer. Skin Cancer  Check your skin from head to toe regularly.  Tell your health care provider about any new moles or changes in moles, especially if: ? There is a change in a mole's size, shape, or color. ? You have a mole that is larger than a pencil eraser.  Always use sunscreen. Apply sunscreen liberally and repeat throughout the day.  Protect yourself by wearing long sleeves, pants, a wide-brimmed hat, and sunglasses when outside. What should I know about heart disease, diabetes, and high blood pressure?  If you are 49-79 years of age, have your blood pressure checked every 3-5 years. If you are 41 years of age or older, have your blood pressure checked every year. You should have your blood pressure measured twice-once when you are at a hospital or clinic, and once when  you are not at a hospital or clinic. Record the average of the two measurements. To check your blood pressure when you are not at a hospital or clinic, you can use: ? An automated blood pressure machine at a pharmacy. ? A home blood pressure monitor.  Talk to your health care provider about your target blood pressure.  If you are between 68-23 years old, ask your health care provider if you should take aspirin to prevent heart disease.  Have regular diabetes screenings by checking your  fasting blood sugar level. ? If you are at a normal weight and have a low risk for diabetes, have this test once every three years after the age of 4. ? If you are overweight and have a high risk for diabetes, consider being tested at a younger age or more often.  A one-time screening for abdominal aortic aneurysm (AAA) by ultrasound is recommended for men aged 32-75 years who are current or former smokers. What should I know about preventing infection? Hepatitis B If you have a higher risk for hepatitis B, you should be screened for this virus. Talk with your health care provider to find out if you are at risk for hepatitis B infection. Hepatitis C Blood testing is recommended for:  Everyone born from [birth year] through 1967.  Anyone with known risk factors for hepatitis C. Sexually Transmitted Diseases (STDs)  You should be screened each year for STDs including gonorrhea and chlamydia if: ? You are sexually active and are younger than 70 years of age. ? You are older than 70 years of age and your health care provider tells you that you are at risk for this type of infection. ? Your sexual activity has changed since you were last screened and you are at an increased risk for chlamydia or gonorrhea. Ask your health care provider if you are at risk.  Talk with your health care provider about whether you are at high risk of being infected with HIV. Your health care provider may recommend a prescription medicine to help prevent HIV infection. What else can I do?  Schedule regular health, dental, and eye exams.  Stay current with your vaccines (immunizations).  Do not use any tobacco products, such as cigarettes, chewing tobacco, and e-cigarettes. If you need help quitting, ask your health care provider.  Limit alcohol intake to no more than 2 drinks per day. One drink equals 12 ounces of beer, 5 ounces of wine, or 1 ounces of hard liquor.  Do not use street drugs.  Do not share needles.   Ask your health care provider for help if you need support or information about quitting drugs.  Tell your health care provider if you often feel depressed.  Tell your health care provider if you have ever been abused or do not feel safe at home. This information is not intended to replace advice given to you by your health care provider. Make sure you discuss any questions you have with your health care provider. Document Released: 12/30/2007 Document Revised: 03/01/2016 Document Reviewed: 04/06/2015 Elsevier Interactive Patient Education  2019 [employer].

## 2018-09-30 ENCOUNTER — Other Ambulatory Visit: Payer: Self-pay | Admitting: Family

## 2018-09-30 DIAGNOSIS — D696 Thrombocytopenia, unspecified: Secondary | ICD-10-CM

## 2018-10-01 ENCOUNTER — Encounter: Payer: Self-pay | Admitting: Family

## 2018-10-02 ENCOUNTER — Other Ambulatory Visit: Payer: Self-pay | Admitting: Family

## 2018-10-02 DIAGNOSIS — I10 Essential (primary) hypertension: Secondary | ICD-10-CM

## 2018-12-30 DIAGNOSIS — E65 Localized adiposity: Secondary | ICD-10-CM | POA: Insufficient documentation

## 2018-12-30 DIAGNOSIS — I10 Essential (primary) hypertension: Secondary | ICD-10-CM | POA: Diagnosis not present

## 2018-12-30 DIAGNOSIS — Z79899 Other long term (current) drug therapy: Secondary | ICD-10-CM | POA: Diagnosis not present

## 2018-12-30 DIAGNOSIS — E7800 Pure hypercholesterolemia, unspecified: Secondary | ICD-10-CM | POA: Diagnosis not present

## 2019-01-24 DIAGNOSIS — H40023 Open angle with borderline findings, high risk, bilateral: Secondary | ICD-10-CM | POA: Diagnosis not present

## 2019-01-24 DIAGNOSIS — H40053 Ocular hypertension, bilateral: Secondary | ICD-10-CM | POA: Diagnosis not present

## 2019-07-25 DIAGNOSIS — H40023 Open angle with borderline findings, high risk, bilateral: Secondary | ICD-10-CM | POA: Diagnosis not present

## 2019-08-04 ENCOUNTER — Other Ambulatory Visit: Payer: Self-pay

## 2019-08-04 ENCOUNTER — Ambulatory Visit
Admission: EM | Admit: 2019-08-04 | Discharge: 2019-08-04 | Disposition: A | Discharge status: Home / Self Care | Payer: Medicare Other | Attending: Emergency Medicine | Admitting: Emergency Medicine

## 2019-08-04 ENCOUNTER — Encounter: Payer: Self-pay | Admitting: Emergency Medicine

## 2019-08-04 DIAGNOSIS — R05 Cough: Secondary | ICD-10-CM | POA: Diagnosis not present

## 2019-08-04 DIAGNOSIS — R059 Cough, unspecified: Secondary | ICD-10-CM

## 2019-08-04 DIAGNOSIS — J0100 Acute maxillary sinusitis, unspecified: Secondary | ICD-10-CM | POA: Diagnosis not present

## 2019-08-04 HISTORY — DX: Essential (primary) hypertension: I10

## 2019-08-04 MED ORDER — GUAIFENESIN-CODEINE 100-10 MG/5ML PO SYRP: 5.0000 mL | ORAL_SOLUTION | Freq: Three times a day (TID) | ORAL | 0 refills | Status: DC | PRN

## 2019-08-04 MED ORDER — AMOXICILLIN-POT CLAVULANATE 875-125 MG PO TABS: 1.0000 | ORAL_TABLET | Freq: Two times a day (BID) | ORAL | 0 refills | Status: DC

## 2019-08-04 NOTE — ED Triage Notes (Signed)
Patient in today c/o cough and sinus drainage x 1 month. Patient took some of wife's codeine cough medicine and that helped his cough. Patient denies fever, body aches or N/V/D.

## 2019-08-04 NOTE — ED Provider Notes (Addendum)
Brett Bradshaw    CSN: [id number] Arrival date & time: 08/04/19  1123      History   Chief Complaint Chief Complaint  Patient presents with  . Cough  . Sinus Problem    HPI Brett Bradshaw is a 69 y.o. male.   Patient presents with sinus congestion, sinus drainage, cough productive of white-yellow phlegm in the mornings, nonproductive cough the rest of the day.  He states the cough is keeping him up at night.  He states he took some of his wife's cough medicine with codeine which helped him sleep without coughing last night.  He denies fever, chills, sore throat, shortness of breath, vomiting, diarrhea, rash, or other symptoms.  The history is provided by the patient.    Past Medical History:  Diagnosis Date  . Chicken pox   . Glaucoma    Bilateral eyes  . Hypertension   . Sarcoidosis 1996    Patient Active Problem List   Diagnosis Date Noted  . Post-nasal drip 05/10/2018  . Cough 05/10/2018  . Erectile dysfunction 01/02/2018  . Chronic allergic rhinitis 01/02/2018  . Elevated PSA 09/19/2017  . Routine general medical examination at a health care facility 09/14/2015  . Thrombocytopenia ([place]) 01/27/2015  . On statin therapy 01/27/2015  . Essential hypertension 01/27/2015    Past Surgical History:  Procedure Laterality Date  . pyloric stenosis     newborn       Home Medications    Prior to Admission medications   Medication Sig Start Date End Date Taking? Authorizing Provider  amLODipine (NORVASC) 5 MG tablet TAKE 1 TABLET DAILY 06/28/18  Yes Burnard Hawthorne, FNP  atorvastatin (LIPITOR) 20 MG tablet TAKE 1 TABLET DAILY AT 6 P.M. (NEEDS TO BE SEEN BY PRIMARY CARE PHYSICIAN TO RECEIVE FURTHER REFILLS) 07/04/18  Yes Arnett, Yvetta Coder, FNP  bimatoprost (LUMIGAN) 0.01 % SOLN 1 drop nightly.  09/09/13  Yes [provider]  amoxicillin-clavulanate (AUGMENTIN) 875-125 MG tablet Take 1 tablet by mouth every 12 (twelve) hours. 08/04/19   Sharion Balloon, NP  guaiFENesin-codeine (ROBITUSSIN AC) 100-10 MG/5ML syrup Take 5 mLs by mouth 3 (three) times daily as needed for cough. 08/04/19   Sharion Balloon, NP  losartan (COZAAR) 50 MG tablet Take 1 tablet (50 mg total) by mouth daily. 01/02/18   Burnard Hawthorne, FNP  sildenafil (VIAGRA) 25 MG tablet Take 1 tablet (25 mg total) by mouth daily as needed for erectile dysfunction. 05/27/18   Burnard Hawthorne, FNP    Family History Family History  Problem Relation Age of Onset  . Cancer Paternal Uncle        Prostate  . Diabetes Brother        Type I  . Dementia Mother   . Stroke Mother   . COPD Father   . Hypertension Father   . Heart disease Father   . Kidney cancer Neg Hx   . Bladder Cancer Neg Hx     Social History Social History   Tobacco Use  . Smoking status: Never Smoker  . Smokeless tobacco: Never Used  Substance Use Topics  . Alcohol use: Yes    Alcohol/week: 5.0 standard drinks    Types: 5 Standard drinks or equivalent per week    Comment: weekly  . Drug use: No     Allergies   Patient has no known allergies.   Review of Systems Review of Systems  Constitutional: Negative for chills and fever.  HENT: Positive for congestion, postnasal drip and sinus pressure. Negative for ear pain and sore throat.   Eyes: Negative for pain and visual disturbance.  Respiratory: Positive for cough. Negative for shortness of breath.   Cardiovascular: Negative for chest pain and palpitations.  Gastrointestinal: Negative for abdominal pain, diarrhea, nausea and vomiting.  Genitourinary: Negative for dysuria and hematuria.  Musculoskeletal: Negative for arthralgias and back pain.  Skin: Negative for color change and rash.  Neurological: Negative for seizures and syncope.  All other systems reviewed and are negative.    Physical Exam Triage Vital Signs ED Triage Vitals [08/04/19 1131]  Enc Vitals Group     BP 117/72     Pulse Rate 69     Resp 18     Temp 99.2 F (37.3 C)      Temp Source Oral     SpO2 96 %     Weight 190 lb (86.2 kg)     Height 6\' 2"  (1.88 m)     Head Circumference      Peak Flow      Pain Score 0     Pain Loc      Pain Edu?      Excl. in [place]?    No data found.  Updated Vital Signs BP 117/72 (BP Location: Left Arm)   Pulse 69   Temp 99.2 F (37.3 C) (Oral)   Resp 18   Ht 6\' 2"  (1.88 m)   Wt 190 lb (86.2 kg)   SpO2 96%   BMI 24.39 kg/m   Visual Acuity Right Eye Distance:   Left Eye Distance:   Bilateral Distance:    Right Eye Near:   Left Eye Near:    Bilateral Near:     Physical Exam Vitals and nursing note reviewed.  Constitutional:      General: He is not in acute distress.    Appearance: He is well-developed. He is not ill-appearing.  HENT:     Head: Normocephalic and atraumatic.     Right Ear: Tympanic membrane normal.     Left Ear: Tympanic membrane normal.     Nose: Nose normal.     Mouth/Throat:     Mouth: Mucous membranes are moist.     Pharynx: Oropharynx is clear.  Eyes:     Conjunctiva/sclera: Conjunctivae normal.  Cardiovascular:     Rate and Rhythm: Normal rate and regular rhythm.     Heart sounds: No murmur.  Pulmonary:     Effort: Pulmonary effort is normal. No respiratory distress.     Breath sounds: Normal breath sounds.  Abdominal:     General: Bowel sounds are normal.     Palpations: Abdomen is soft.     Tenderness: There is no abdominal tenderness. There is no guarding or rebound.  Musculoskeletal:     Cervical back: Neck supple.  Skin:    General: Skin is warm and dry.     Findings: No rash.  Neurological:     General: No focal deficit present.     Mental Status: He is alert and oriented to person, place, and time.  Psychiatric:        Mood and Affect: Mood normal.        Behavior: Behavior normal.      UC Treatments / Results  Labs (all labs ordered are listed, but only abnormal results are displayed) Labs Reviewed - No data to display  EKG   Radiology No results  found.  Procedures  Procedures (including critical care time)  Medications Ordered in UC Medications - No data to display  Initial Impression / Assessment and Plan / UC Course  I have reviewed the triage vital signs and the nursing notes.  Pertinent labs & imaging results that were available during my care of the patient were reviewed by me and considered in my medical decision making (see chart for details).    Acute nonrecurrent maxillary sinusitis, cough.  Treating with Augmentin and Robitussin-AC.  Precautions for drowsiness with Robitussin-AC discussed with patient.  Instructed him to follow-up with his PCP if his symptoms are not improving.  Patient agrees to plan of care.     Final Clinical Impressions(s) / UC Diagnoses   Final diagnoses:  Acute non-recurrent maxillary sinusitis  Cough     Discharge Instructions     Take the antibiotic as directed.    You can also take the Robitussin AC as directed; do not drive, operate machinery, or drink alcohol with this medication as it may cause drowsiness.    Follow up with your primary care provider if your symptoms are not improving.        ED Prescriptions    Medication Sig Dispense Auth. Provider   amoxicillin-clavulanate (AUGMENTIN) 875-125 MG tablet Take 1 tablet by mouth every 12 (twelve) hours. 14 tablet Barkley Boards H, NP   guaiFENesin-codeine (ROBITUSSIN AC) 100-10 MG/5ML syrup Take 5 mLs by mouth 3 (three) times daily as needed for cough. 120 mL Sharion Balloon, NP     I have reviewed the PDMP during this encounter.   Sharion Balloon, NP 08/04/19 1204    Sharion Balloon, NP 08/04/19 1205

## 2019-08-04 NOTE — Discharge Instructions (Addendum)
Take the antibiotic as directed.    You can also take the Robitussin AC as directed; do not drive, operate machinery, or drink alcohol with this medication as it may cause drowsiness.    Follow up with your primary care provider if your symptoms are not improving.

## 2019-08-08 ENCOUNTER — Telehealth: Payer: Self-pay | Admitting: Family

## 2019-08-08 NOTE — Telephone Encounter (Signed)
LM for Brett Locus, PA to call back. I asked that he ask office to staff to please get me if unable to reach or if he would prefer to speak directly to [facility] to let us know that as well.

## 2019-08-08 NOTE — Telephone Encounter (Signed)
Vilinda Flake, PA with [facility] would like a call back about a medication and lab work asap. Please advise

## 2019-08-18 ENCOUNTER — Other Ambulatory Visit: Payer: Self-pay | Admitting: Family

## 2019-08-18 DIAGNOSIS — Z79899 Other long term (current) drug therapy: Secondary | ICD-10-CM

## 2019-09-29 ENCOUNTER — Other Ambulatory Visit: Payer: Self-pay

## 2019-09-29 ENCOUNTER — Ambulatory Visit: Payer: Medicare Other | Admitting: Family

## 2019-09-29 ENCOUNTER — Ambulatory Visit (INDEPENDENT_AMBULATORY_CARE_PROVIDER_SITE_OTHER): Payer: Medicare Other

## 2019-09-29 ENCOUNTER — Telehealth: Payer: Self-pay | Admitting: Family

## 2019-09-29 ENCOUNTER — Encounter: Payer: Self-pay | Admitting: Family

## 2019-09-29 ENCOUNTER — Ambulatory Visit (INDEPENDENT_AMBULATORY_CARE_PROVIDER_SITE_OTHER): Payer: Medicare Other | Admitting: Family

## 2019-09-29 VITALS — BP 122/70 | HR 63 | Temp 96.6°F | Ht 73.0 in | Wt 188.8 lb

## 2019-09-29 VITALS — Ht 74.0 in | Wt 190.0 lb

## 2019-09-29 DIAGNOSIS — E785 Hyperlipidemia, unspecified: Secondary | ICD-10-CM | POA: Diagnosis not present

## 2019-09-29 DIAGNOSIS — R972 Elevated prostate specific antigen [PSA]: Secondary | ICD-10-CM | POA: Diagnosis not present

## 2019-09-29 DIAGNOSIS — I10 Essential (primary) hypertension: Secondary | ICD-10-CM | POA: Diagnosis not present

## 2019-09-29 DIAGNOSIS — D696 Thrombocytopenia, unspecified: Secondary | ICD-10-CM | POA: Diagnosis not present

## 2019-09-29 DIAGNOSIS — Z0000 Encounter for general adult medical examination without abnormal findings: Secondary | ICD-10-CM | POA: Diagnosis not present

## 2019-09-29 LAB — CBC WITH DIFFERENTIAL/PLATELET
Basophils Absolute: 0 10*3/uL (ref 0.0–0.1)
Basophils Relative: 0.4 % (ref 0.0–3.0)
Eosinophils Absolute: 0.1 10*3/uL (ref 0.0–0.7)
Eosinophils Relative: 0.9 % (ref 0.0–5.0)
HCT: 45.6 % (ref 39.0–52.0)
Hemoglobin: 16.1 g/dL (ref 13.0–17.0)
Lymphocytes Relative: 22.4 % (ref 12.0–46.0)
Lymphs Abs: 1.7 10*3/uL (ref 0.7–4.0)
MCHC: 35.2 g/dL (ref 30.0–36.0)
MCV: 89.9 fl (ref 78.0–100.0)
Monocytes Absolute: 0.6 10*3/uL (ref 0.1–1.0)
Monocytes Relative: 8.3 % (ref 3.0–12.0)
Neutro Abs: 5.2 10*3/uL (ref 1.4–7.7)
Neutrophils Relative %: 68 % (ref 43.0–77.0)
Platelets: 143 10*3/uL — ABNORMAL LOW (ref 150.0–400.0)
RBC: 5.08 Mil/uL (ref 4.22–5.81)
RDW: 14 % (ref 11.5–15.5)
WBC: 7.6 10*3/uL (ref 4.0–10.5)

## 2019-09-29 LAB — LIPID PANEL
Cholesterol: 161 mg/dL (ref 0–200)
HDL: 43.3 mg/dL (ref >39.00)
LDL Cholesterol: 91 mg/dL (ref 0–99)
NonHDL: 118.07
Total CHOL/HDL Ratio: 4
Triglycerides: 137 mg/dL (ref 0.0–149.0)
VLDL: 27.4 mg/dL (ref 0.0–40.0)

## 2019-09-29 LAB — PSA: PSA: 17.35 ng/mL — ABNORMAL HIGH (ref 0.10–4.00)

## 2019-09-29 LAB — COMPREHENSIVE METABOLIC PANEL
ALT: 24 U/L (ref 0–53)
AST: 22 U/L (ref 0–37)
Albumin: 4.3 g/dL (ref 3.5–5.2)
Alkaline Phosphatase: 65 U/L (ref 39–117)
BUN: 18 mg/dL (ref 6–23)
CO2: 31 mEq/L (ref 19–32)
Calcium: 9.5 mg/dL (ref 8.4–10.5)
Chloride: 98 mEq/L (ref 96–112)
Creatinine, Ser: 0.95 mg/dL (ref 0.40–1.50)
GFR: 78.19 mL/min (ref >60.00)
Glucose, Bld: 115 mg/dL — ABNORMAL HIGH (ref 70–99)
Potassium: 3.2 mEq/L — ABNORMAL LOW (ref 3.5–5.1)
Sodium: 137 mEq/L (ref 135–145)
Total Bilirubin: 1.3 mg/dL — ABNORMAL HIGH (ref 0.2–1.2)
Total Protein: 6.8 g/dL (ref 6.0–8.3)

## 2019-09-29 MED ORDER — POTASSIUM CHLORIDE ER 10 MEQ PO TBCR: 10.0000 meq | EXTENDED_RELEASE_TABLET | ORAL | 1 refills | Status: DC

## 2019-09-29 NOTE — Progress Notes (Signed)
Subjective:    Patient ID: Brett Bradshaw, male    DOB: Dec 21, 1950, 69 y.o.   MRN: [id number]  CC: Brett Bradshaw is a 69 y.o. male who presents today for follow up.   HPI: HTN- doing well choloritan Plans to start lifing weights again.   HLD- compliant with medication.   No   Denies exertional chest pain or pressure, numbness or tingling radiating to left arm or jaw, palpitations, dizziness, frequent headaches, changes in vision, or shortness of breath.   Dr Jalene Mullet Initial consult with Dr Domingo Cocking 12/2018. Stopped ASA, lisinopril, amlodipine. Started chlorthalidone.   HISTORY:  Past Medical History:  Diagnosis Date  . Chicken pox   . Glaucoma    Bilateral eyes  . Hypertension   . Sarcoidosis 1996   Past Surgical History:  Procedure Laterality Date  . pyloric stenosis     newborn   Family History  Problem Relation Age of Onset  . Cancer Paternal Uncle        Prostate  . Diabetes Brother        Type I  . Dementia Mother   . Stroke Mother   . COPD Father   . Hypertension Father   . Heart disease Father   . Kidney cancer Neg Hx   . Bladder Cancer Neg Hx     Allergies: Patient has no known allergies. Current Outpatient Medications on File Prior to Visit  Medication Sig Dispense Refill  . atorvastatin (LIPITOR) 20 MG tablet TAKE 1 TABLET DAILY AT 6 P.M. (NEEDS TO BE SEEN BY PRIMARY CARE PHYSICIAN TO RECEIVE FURTHER REFILLS) 90 tablet 3  . bimatoprost (LUMIGAN) 0.01 % SOLN 1 drop nightly.     . chlorthalidone (HYGROTON) 25 MG tablet Take 25 mg by mouth daily.    . Cholecalciferol 50 MCG (2000 UT) CAPS Take 1 tablet by mouth daily.    . sildenafil (VIAGRA) 25 MG tablet Take 1 tablet (25 mg total) by mouth daily as needed for erectile dysfunction. 10 tablet 1   No current facility-administered medications on file prior to visit.    Social History   Tobacco Use  . Smoking status: Never Smoker  . Smokeless tobacco: Never Used  Substance Use Topics  . Alcohol  use: Yes    Alcohol/week: 5.0 standard drinks    Types: 5 Standard drinks or equivalent per week    Comment: weekly  . Drug use: No    Review of Systems  Constitutional: Negative for chills and fever.  Respiratory: Negative for cough.   Cardiovascular: Negative for chest pain and palpitations.  Gastrointestinal: Negative for nausea and vomiting.  Genitourinary: Negative for decreased urine volume and difficulty urinating.      Objective:    BP 122/70   Pulse 63   Temp (!) 96.6 F (35.9 C) (Temporal)   Ht 6\' 1"  (1.854 m)   Wt 188 lb 12.8 oz (85.6 kg)   SpO2 99%   BMI 24.91 kg/m  BP Readings from Last 3 Encounters:  09/29/19 122/70  08/04/19 117/72  09/25/18 128/70   Wt Readings from Last 3 Encounters:  09/29/19 188 lb 12.8 oz (85.6 kg)  09/29/19 190 lb (86.2 kg)  08/04/19 190 lb (86.2 kg)    Physical Exam Vitals reviewed.  Constitutional:      Appearance: Normal appearance. He is well-developed.  Cardiovascular:     Rate and Rhythm: Regular rhythm.     Heart sounds: Normal heart sounds.  Pulmonary:     Effort:  Pulmonary effort is normal. No respiratory distress.     Breath sounds: Normal breath sounds. No wheezing, rhonchi or rales.  Abdominal:     General: Bowel sounds are normal. There is no distension.     Palpations: Abdomen is soft. Abdomen is not rigid. There is no mass.     Tenderness: There is no abdominal tenderness. There is no guarding or rebound.     Comments: No suprapubic tenderness.   Genitourinary:    Prostate: Normal. Not enlarged and not tender.     Rectum: Normal. No mass, tenderness or external hemorrhoid.     Comments: No masses or nodules appreciated during DRE lobes of prostate.  Prostate nontender, not boggy, asymmetric  Skin:    General: Skin is warm and dry.  Neurological:     Mental Status: He is alert.  Psychiatric:        Speech: Speech normal.        Behavior: Behavior normal.        Assessment & Plan:   Problem List  Items Addressed This Visit      Cardiovascular and Mediastinum   Essential hypertension - Primary    Controlled. Continue chlorthalidone.       Relevant Medications   chlorthalidone (HYGROTON) 25 MG tablet   Other Relevant Orders   Comprehensive metabolic panel     Other   Elevated PSA    Stable. Asymptomatic. Normal prostate exam. Pending psa      Relevant Orders   PSA   HLD (hyperlipidemia)    Stable. Pending lipid panel       Relevant Medications   chlorthalidone (HYGROTON) 25 MG tablet   Other Relevant Orders   Lipid panel   Thrombocytopenia ([place])    History of. Continue to follow CBC. Pending      Relevant Orders   CBC with Differential/Platelet       I have discontinued Ivon Pozzi's losartan and amLODipine. I am also having him maintain his bimatoprost, sildenafil, atorvastatin, chlorthalidone, and Cholecalciferol.   No orders of the defined types were placed in this encounter.   Return precautions given.   Risks, benefits, and alternatives of the medications and treatment plan prescribed today were discussed, and patient expressed understanding.   Education regarding symptom management and diagnosis given to patient on AVS.  Continue to follow with Burnard Hawthorne, FNP for routine health maintenance.   [name] and I agreed with plan.   Mable Paris, FNP

## 2019-09-29 NOTE — Assessment & Plan Note (Signed)
Controlled. Continue chlorthalidone.

## 2019-09-29 NOTE — Assessment & Plan Note (Signed)
Stable. Asymptomatic. Normal prostate exam. Pending psa

## 2019-09-29 NOTE — Telephone Encounter (Signed)
Sarah I spoke with patient about all Would you ensure he has labs scheduled in ONE week?  I called and discussed with patient in regards to low potassium.  Advised him to take potassium chloride 10 mEq 2 tablets TOday and then 2 tablets tomorrow.  From that point forward he will take 1 tablet every other day.  He verbalized understanding of this.  He said he preferred to pick up medication tomorrow morning.  Advised him against this due to risk of arrhythmia.  He politely declines and states he will pick up tomorrow.  He stated that if he felt any chest pain or shortness of breath, he would go to emergency room. Also advised him of his  bilirubin.  We will recheck this again with his labs next week. We discussed his very elevated PSA.  He has no prostate pain or concern for prostate infection at this time.  Patient like this rechecked before he goes back to urology.  I ordered a PSA as well.  Patient will call to schedule labs in 1 week.

## 2019-09-29 NOTE — Patient Instructions (Signed)
Nice to see you and blood pressure looks great.   Let me know if you need anything!

## 2019-09-29 NOTE — Assessment & Plan Note (Signed)
History of. Continue to follow CBC. Pending

## 2019-09-29 NOTE — Progress Notes (Addendum)
Subjective:   Brett Bradshaw is a 69 y.o. male who presents for Medicare Annual/Subsequent preventive examination.  Review of Systems:  No ROS.  Medicare Wellness Virtual Visit.  Visual/audio telehealth visit, UTA vital signs.   Ht/Wt provided. See social history for additional risk factors.   Cardiac Risk Factors include: advanced age (>81men, >6 women);hypertension;male gender     Objective:    Vitals: Ht 6\' 2"  (1.88 m)   Wt 190 lb (86.2 kg)   BMI 24.39 kg/m   Body mass index is 24.39 kg/m.  Advanced Directives 09/29/2019 09/25/2018 09/05/2017  Does Patient Have a Medical Advance Directive? No No No  Would patient like information on creating a medical advance directive? No - Patient declined No - Patient declined No - Patient declined    Tobacco Social History   Tobacco Use  Smoking Status Never Smoker  Smokeless Tobacco Never Used     Counseling given: Not Answered   Clinical Intake:  Pre-visit preparation completed: Yes        Diabetes: No  How often do you need to have someone help you when you read instructions, pamphlets, or other written materials from your doctor or pharmacy?: 1 - Never  Interpreter Needed?: No     Past Medical History:  Diagnosis Date  . Chicken pox   . Glaucoma    Bilateral eyes  . Hypertension   . Sarcoidosis 1996   Past Surgical History:  Procedure Laterality Date  . pyloric stenosis     newborn   Family History  Problem Relation Age of Onset  . Cancer Paternal Uncle        Prostate  . Diabetes Brother        Type I  . Dementia Mother   . Stroke Mother   . COPD Father   . Hypertension Father   . Heart disease Father   . Kidney cancer Neg Hx   . Bladder Cancer Neg Hx    Social History   Socioeconomic History  . Marital status: Married    Spouse name: Not on file  . Number of children: Not on file  . Years of education: Not on file  . Highest education level: Not on file  Occupational History  . Not  on file  Tobacco Use  . Smoking status: Never Smoker  . Smokeless tobacco: Never Used  Substance and Sexual Activity  . Alcohol use: Yes    Alcohol/week: 5.0 standard drinks    Types: 5 Standard drinks or equivalent per week    Comment: weekly  . Drug use: No  . Sexual activity: Yes    Partners: Female    Comment: Wife  Other Topics Concern  . Not on file  Social History Narrative   Works as a Chief of Staff ,    Lives with wife, Therapist, sports.    Children- 2    Pets: none currently    Caffeine- No soda, 1 cup of coffee daily    Enjoys keeping up with financial information    Social Determinants of Health   Financial Resource Strain:   . Difficulty of Paying Living Expenses:   Food Insecurity:   . Worried About Charity fundraiser in the Last Year:   . Arboriculturist in the Last Year:   Transportation Needs:   . Film/video editor (Medical):   Marland Kitchen Lack of Transportation (Non-Medical):   Physical Activity:   . Days of Exercise per Week:   . Minutes  of Exercise per Session:   Stress:   . Feeling of Stress :   Social Connections:   . Frequency of Communication with Friends and Family:   . Frequency of Social Gatherings with Friends and Family:   . Attends Religious Services:   . Active Member of Clubs or Organizations:   . Attends Archivist Meetings:   Marland Kitchen Marital Status:     Outpatient Encounter Medications as of 09/29/2019  Medication Sig  . amLODipine (NORVASC) 5 MG tablet TAKE 1 TABLET DAILY  . atorvastatin (LIPITOR) 20 MG tablet TAKE 1 TABLET DAILY AT 6 P.M. (NEEDS TO BE SEEN BY PRIMARY CARE PHYSICIAN TO RECEIVE FURTHER REFILLS)  . bimatoprost (LUMIGAN) 0.01 % SOLN 1 drop nightly.   Marland Kitchen losartan (COZAAR) 50 MG tablet Take 1 tablet (50 mg total) by mouth daily.  . sildenafil (VIAGRA) 25 MG tablet Take 1 tablet (25 mg total) by mouth daily as needed for erectile dysfunction.  . [DISCONTINUED] amoxicillin-clavulanate (AUGMENTIN) 875-125 MG tablet Take 1 tablet by  mouth every 12 (twelve) hours.  . [DISCONTINUED] guaiFENesin-codeine (ROBITUSSIN AC) 100-10 MG/5ML syrup Take 5 mLs by mouth 3 (three) times daily as needed for cough.   No facility-administered encounter medications on file as of 09/29/2019.    Activities of Daily Living In your present state of health, do you have any difficulty performing the following activities: 09/29/2019  Hearing? N  Vision? N  Difficulty concentrating or making decisions? N  Walking or climbing stairs? N  Dressing or bathing? N  Doing errands, shopping? N  Preparing Food and eating ? N  Using the Toilet? N  In the past six months, have you accidently leaked urine? N  Do you have problems with loss of bowel control? N  Managing your Medications? N  Managing your Finances? N  Housekeeping or managing your Housekeeping? N  Some recent data might be hidden    Patient Care Team: Burnard Hawthorne, FNP as PCP - General (Family Medicine)   Assessment:   This is a routine wellness examination for [name].  Nurse connected with patient 09/29/19 at  9:00 AM EDT by a telephone enabled telemedicine application and verified that I am speaking with the correct person using two identifiers. Patient stated full name and DOB. Patient gave permission to continue with virtual visit. Patient's location was at home and Nurse's location was at [place] office.   Patient is alert and oriented x3. Patient denies difficulty focusing or concentrating. Patient likes to read and studies 3-6 hours daily as a medical audit for brain stimulation.   Health Maintenance Due: -First covid vaccine complete. Plans to update as appropriate.  See completed HM at the end of note.   Eye: Visual acuity not assessed. Virtual visit. Followed by their ophthalmologist.  Glaucoma; drops in use.   Dental: Visits every 6 months.    Hearing: Demonstrates normal hearing during visit.  Safety:  Patient feels safe at home- yes Patient does have  smoke detectors at home- yes Patient does wear sunscreen or protective clothing when in direct sunlight - yes Patient does wear seat belt when in a moving vehicle - yes Patient drives- yes Adequate lighting in walkways free from debris- yes Grab bars and handrails used as appropriate- yes Ambulates with an assistive device- no Cell phone on person when ambulating outside of the home- yes  Social: Alcohol intake -  yes      Smoking history- never   Smokers in home? none Illicit  drug use? none  Medication: Taking as directed and without issues.  Self managed - yes   Covid-19: Precautions and sickness symptoms discussed. Wears mask, social distancing, hand hygiene as appropriate.   Activities of Daily Living Patient denies needing assistance with: household chores, feeding themselves, getting from bed to chair, getting to the toilet, bathing/showering, dressing, managing money, or preparing meals.   Discussed the importance of a healthy diet, water intake and the benefits of aerobic exercise.  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 Plans to rejoin gym.   Diet:  Regular, healthy  Water: good intake Caffeine: 2 cups of coffee  Other Providers Patient Care Team: Burnard Hawthorne, FNP as PCP - General (Family Medicine)  Exercise Activities and Dietary recommendations Current Exercise Habits: Home exercise routine, Time (Minutes): 30, Frequency (Times/Week): 4, Weekly Exercise (Minutes/Week): 120, Intensity: Moderate  Goals      Patient Stated   . Maintain Healthy Lifestyle (pt-stated)     Stay proactive regarding over all health       Fall Risk Fall Risk  09/29/2019 09/25/2018 09/05/2017 09/11/2016 05/16/2016  Falls in the past year? 0 0 No No No  Follow up Falls evaluation completed - - - -   Timed Get Up and Go Performed: no, virtual visit  Depression Screen PHQ 2/9 Scores 09/29/2019 09/25/2018 09/05/2017 09/11/2016  PHQ - 2 Score 0 0 0 0      Cognitive Function MMSE - Mini Mental State Exam 09/05/2017  Orientation to time 5  Orientation to Place 5  Registration 3  Attention/ Calculation 5  Recall 3  Language- name 2 objects 2  Language- repeat 1  Language- follow 3 step command 3  Language- read & follow direction 1  Write a sentence 1  Copy design 1  Total score 30     6CIT Screen 09/29/2019 09/25/2018  What Year? 0 points 0 points  What month? 0 points 0 points  What time? 0 points 0 points  Count back from 20 - 0 points  Months in reverse - 0 points  Repeat phrase - 0 points  Total Score - 0    Immunization History  Administered Date(s) Administered  . Influenza, High Dose Seasonal PF 09/05/2017, 09/25/2018  . Influenza,inj,Quad PF,6+ Mos 09/09/2015  . Influenza-Unspecified 08/17/2017  . Pneumococcal Conjugate-13 09/11/2016  . Pneumococcal Polysaccharide-23 09/19/2017  . Td 09/09/2015   Screening Tests Health Maintenance  Topic Date Due  . INFLUENZA VACCINE  02/15/2019  . COLONOSCOPY  11/01/2020  . TETANUS/TDAP  09/08/2025  . Hepatitis C Screening  Completed  . PNA vac Low Risk Adult  Completed      Plan:   Keep all routine maintenance appointments.   Cpe today at 10:00  Medicare Attestation I have personally reviewed: The patient's medical and social history Their use of alcohol, tobacco or illicit drugs Their current medications and supplements The patient's functional ability including ADLs,fall risks, home safety risks, cognitive, and hearing and visual impairment Diet and physical activities Evidence for depression   I have reviewed and discussed with patient certain preventive protocols, quality metrics, and best practice recommendations.      Varney Biles, LPN  [id number]  Agree with plan. Mable Paris, NP

## 2019-09-29 NOTE — Patient Instructions (Signed)
  Mr. Brett Bradshaw , Thank you for taking time to come for your Medicare Wellness Visit. I appreciate your ongoing commitment to your health goals. Please review the following plan we discussed and let me know if I can assist you in the future.   These are the goals we discussed: Goals      Patient Stated   . Maintain Healthy Lifestyle (pt-stated)     Stay proactive regarding over all health       This is a list of the screening recommended for you and due dates:  Health Maintenance  Topic Date Due  . Flu Shot  02/15/2019  . Colon Cancer Screening  11/01/2020  . Tetanus Vaccine  09/08/2025  .  Hepatitis C: One time screening is recommended by Center for Disease Control  (CDC) for  adults born from [birth year] through 1967.   Completed  . Pneumonia vaccines  Completed

## 2019-09-29 NOTE — Assessment & Plan Note (Signed)
Stable. Pending lipid panel.  

## 2019-09-30 ENCOUNTER — Telehealth: Payer: Self-pay | Admitting: Family

## 2019-09-30 ENCOUNTER — Other Ambulatory Visit: Payer: Self-pay | Admitting: Family

## 2019-09-30 DIAGNOSIS — R972 Elevated prostate specific antigen [PSA]: Secondary | ICD-10-CM

## 2019-09-30 DIAGNOSIS — R300 Dysuria: Secondary | ICD-10-CM

## 2019-09-30 NOTE — Telephone Encounter (Signed)
I have called & left message on nurse line at Brett Bradshaw, [place] office. I asked that someone call back within in the day to advise on if urgent referral was needed.

## 2019-09-30 NOTE — Telephone Encounter (Signed)
I called patient & he had just went to the pharmacy to pick up potassium, which he will start today. He stated that he had no symptoms & felt completely normal with no SOB or CP.  I also scheduled patient for lab recheck in one week out. I let him know that we had reached out to [place] office & am waiting on a call back.

## 2019-09-30 NOTE — Telephone Encounter (Signed)
Call pt and advise per Janett Billow at urology  We can repeat PSA, order urine studies however if PSA is elevated again, she will set patient up for biopsy.   Please have patient come in today or tomorrow for urine

## 2019-09-30 NOTE — Telephone Encounter (Signed)
I spoke with patient & he is scheduled for PSA as well as urine culture tomorrow.

## 2019-09-30 NOTE — Telephone Encounter (Signed)
I tried to call patient but VM was full. Will continue to try to reach.

## 2019-09-30 NOTE — Telephone Encounter (Signed)
I spoke with Jinny Blossom from [facility] office. She spoke directly with Janett Billow & she asked if we could order urine culture for patient & have him come in to do the next day or so, so that they can rule out prostatitis. If urine culture is negative & PSA is still elevated they are going to go ahead & set patient up for prostate biopsy.   Okay to order urine culture & have patient scheduled? Should he have he just have labs rechecked when he comes in for this or wait the full week? Megan asked me if knew if patient had ejaculated w/ in the last 24hrs & I told her I did not know that that information.

## 2019-09-30 NOTE — Telephone Encounter (Signed)
Call pt's urologist at [facility] He saw Cheron Schaumann for elevated PSA 12/2017 PSA returned yesterday at 71 Patient preferred to recheck his PSA next week as he had elevated psa in the past.  He denies pain with bowel movement, fever, prostate pain.  I wanted Jessica's advice if that felt appropriate to wait one week or if he needed urgent referral back to her office  Please advise

## 2019-10-01 ENCOUNTER — Other Ambulatory Visit: Payer: Self-pay | Admitting: Family

## 2019-10-01 ENCOUNTER — Other Ambulatory Visit (INDEPENDENT_AMBULATORY_CARE_PROVIDER_SITE_OTHER): Payer: Medicare Other

## 2019-10-01 ENCOUNTER — Other Ambulatory Visit: Payer: Self-pay

## 2019-10-01 ENCOUNTER — Encounter: Payer: Self-pay | Admitting: Family

## 2019-10-01 DIAGNOSIS — R972 Elevated prostate specific antigen [PSA]: Secondary | ICD-10-CM | POA: Diagnosis not present

## 2019-10-01 DIAGNOSIS — R300 Dysuria: Secondary | ICD-10-CM

## 2019-10-01 DIAGNOSIS — I10 Essential (primary) hypertension: Secondary | ICD-10-CM

## 2019-10-01 LAB — PSA: PSA: 13.14 ng/mL — ABNORMAL HIGH (ref 0.10–4.00)

## 2019-10-01 LAB — COMPREHENSIVE METABOLIC PANEL
ALT: 21 U/L (ref 0–53)
AST: 18 U/L (ref 0–37)
Albumin: 4.3 g/dL (ref 3.5–5.2)
Alkaline Phosphatase: 61 U/L (ref 39–117)
BUN: 18 mg/dL (ref 6–23)
CO2: 32 mEq/L (ref 19–32)
Calcium: 9.8 mg/dL (ref 8.4–10.5)
Chloride: 97 mEq/L (ref 96–112)
Creatinine, Ser: 0.98 mg/dL (ref 0.40–1.50)
GFR: 75.43 mL/min (ref >60.00)
Glucose, Bld: 95 mg/dL (ref 70–99)
Potassium: 3.4 mEq/L — ABNORMAL LOW (ref 3.5–5.1)
Sodium: 138 mEq/L (ref 135–145)
Total Bilirubin: 1.4 mg/dL — ABNORMAL HIGH (ref 0.2–1.2)
Total Protein: 6.9 g/dL (ref 6.0–8.3)

## 2019-10-01 LAB — HEPATIC FUNCTION PANEL
ALT: 21 U/L (ref 0–53)
AST: 18 U/L (ref 0–37)
Albumin: 4.3 g/dL (ref 3.5–5.2)
Alkaline Phosphatase: 61 U/L (ref 39–117)
Bilirubin, Direct: 0.3 mg/dL (ref 0.0–0.3)
Total Bilirubin: 1.4 mg/dL — ABNORMAL HIGH (ref 0.2–1.2)
Total Protein: 6.9 g/dL (ref 6.0–8.3)

## 2019-10-01 LAB — URINALYSIS, ROUTINE W REFLEX MICROSCOPIC
Bilirubin Urine: NEGATIVE
Hgb urine dipstick: NEGATIVE
Ketones, ur: NEGATIVE
Leukocytes,Ua: NEGATIVE
Nitrite: NEGATIVE
RBC / HPF: NONE SEEN (ref 0–?)
Specific Gravity, Urine: 1.015 (ref 1.000–1.030)
Urine Glucose: NEGATIVE
Urobilinogen, UA: 2 — AB (ref 0.0–1.0)
pH: 7 (ref 5.0–8.0)

## 2019-10-02 LAB — URINE CULTURE
MICRO NUMBER:: 10261633
Result:: NO GROWTH
SPECIMEN QUALITY:: ADEQUATE

## 2019-10-06 ENCOUNTER — Other Ambulatory Visit: Payer: Medicare Other

## 2019-10-23 ENCOUNTER — Other Ambulatory Visit: Payer: Self-pay | Admitting: Family

## 2019-10-23 DIAGNOSIS — I10 Essential (primary) hypertension: Secondary | ICD-10-CM

## 2019-10-23 NOTE — Telephone Encounter (Signed)
Refill request for potassium, last seen 09-29-19, last filled 09-29-19.  Please advise. Last checked:10-01-19 Results: 3.4

## 2019-10-24 ENCOUNTER — Telehealth: Payer: Self-pay | Admitting: Family

## 2019-10-24 DIAGNOSIS — I10 Essential (primary) hypertension: Secondary | ICD-10-CM

## 2019-10-24 NOTE — Telephone Encounter (Signed)
I called and let patient know that potassium needed to be checked in a few weeks. He is scheduled 5/3 for BMP.

## 2019-10-24 NOTE — Telephone Encounter (Signed)
Call patient I have refilled his potassium to be taken every other day. I would Like to recheck his potassium in 3 to 4 weeks to ensure this is the correct dose.  Please schedule.  I already ordered BMP

## 2019-10-27 DIAGNOSIS — R972 Elevated prostate specific antigen [PSA]: Secondary | ICD-10-CM | POA: Diagnosis not present

## 2019-11-17 ENCOUNTER — Other Ambulatory Visit: Payer: Self-pay

## 2019-11-17 ENCOUNTER — Other Ambulatory Visit (INDEPENDENT_AMBULATORY_CARE_PROVIDER_SITE_OTHER): Payer: Medicare Other

## 2019-11-17 ENCOUNTER — Other Ambulatory Visit: Payer: Self-pay | Admitting: Family

## 2019-11-17 DIAGNOSIS — I10 Essential (primary) hypertension: Secondary | ICD-10-CM | POA: Diagnosis not present

## 2019-11-17 DIAGNOSIS — R899 Unspecified abnormal finding in specimens from other organs, systems and tissues: Secondary | ICD-10-CM

## 2019-11-17 LAB — BASIC METABOLIC PANEL
BUN: 18 mg/dL (ref 6–23)
CO2: 33 mEq/L — ABNORMAL HIGH (ref 19–32)
Calcium: 9.7 mg/dL (ref 8.4–10.5)
Chloride: 97 mEq/L (ref 96–112)
Creatinine, Ser: 1.09 mg/dL (ref 0.40–1.50)
GFR: 66.69 mL/min (ref >60.00)
Glucose, Bld: 101 mg/dL — ABNORMAL HIGH (ref 70–99)
Potassium: 3.3 mEq/L — ABNORMAL LOW (ref 3.5–5.1)
Sodium: 137 mEq/L (ref 135–145)

## 2019-11-17 MED ORDER — POTASSIUM CHLORIDE ER 10 MEQ PO TBCR: 10.0000 meq | EXTENDED_RELEASE_TABLET | Freq: Every day | ORAL | 1 refills | Status: DC

## 2019-12-01 ENCOUNTER — Other Ambulatory Visit: Payer: Self-pay

## 2019-12-01 ENCOUNTER — Other Ambulatory Visit (INDEPENDENT_AMBULATORY_CARE_PROVIDER_SITE_OTHER): Payer: Medicare Other

## 2019-12-01 DIAGNOSIS — R899 Unspecified abnormal finding in specimens from other organs, systems and tissues: Secondary | ICD-10-CM | POA: Diagnosis not present

## 2019-12-01 LAB — BASIC METABOLIC PANEL
BUN: 17 mg/dL (ref 6–23)
CO2: 31 mEq/L (ref 19–32)
Calcium: 9.3 mg/dL (ref 8.4–10.5)
Chloride: 100 mEq/L (ref 96–112)
Creatinine, Ser: 0.94 mg/dL (ref 0.40–1.50)
GFR: 79.11 mL/min (ref >60.00)
Glucose, Bld: 115 mg/dL — ABNORMAL HIGH (ref 70–99)
Potassium: 3.8 mEq/L (ref 3.5–5.1)
Sodium: 137 mEq/L (ref 135–145)

## 2019-12-04 ENCOUNTER — Ambulatory Visit
Admission: EM | Admit: 2019-12-04 | Discharge: 2019-12-04 | Disposition: A | Discharge status: Home / Self Care | Payer: Medicare Other | Attending: Family Medicine | Admitting: Family Medicine

## 2019-12-04 ENCOUNTER — Encounter: Payer: Self-pay | Admitting: Emergency Medicine

## 2019-12-04 ENCOUNTER — Other Ambulatory Visit: Payer: Self-pay

## 2019-12-04 DIAGNOSIS — H6123 Impacted cerumen, bilateral: Secondary | ICD-10-CM | POA: Diagnosis not present

## 2019-12-04 DIAGNOSIS — H938X3 Other specified disorders of ear, bilateral: Secondary | ICD-10-CM | POA: Diagnosis not present

## 2019-12-04 NOTE — ED Triage Notes (Signed)
Patient in today c/o wax in his ears R>L x 1 week. Patient has been using OTC wax softener/remover without relief.

## 2019-12-04 NOTE — Discharge Instructions (Addendum)
We cleaned your ears out in the office today.  To help prevent wax buildup, you may clean your ears at home with 1/2 peroxide, 1/2 water solution 1-2 times per week.   If you are continuing to have issues with your ears, I would have you follow up with your primary care provider or ENT.

## 2019-12-04 NOTE — ED Provider Notes (Signed)
[facility]   [id number]:[id number] 12/04/19 Arrival Time: 0831  CC: EAR PAIN  SUBJECTIVE: History from: patient.  Brett Bradshaw is a 69 y.o. male who presents with of bilateral ear fullness for the past week. Denies a precipitating event, such as swimming or wearing ear plugs. Patient states the fullness is constant. Patient has tried debrox without relief. Reportssimilar symptoms in the past that improved with ear irrigation.  Denies fever, chills, fatigue, sinus pain, rhinorrhea, ear discharge, sore throat, SOB, wheezing, chest pain, nausea, changes in bowel or bladder habits.    ROS: As per HPI.  All other pertinent ROS negative.     Past Medical History:  Diagnosis Date  . Chicken pox   . Glaucoma    Bilateral eyes  . Hypertension   . Sarcoidosis 1996   Past Surgical History:  Procedure Laterality Date  . pyloric stenosis     newborn   No Known Allergies No current facility-administered medications on file prior to encounter.   Current Outpatient Medications on File Prior to Encounter  Medication Sig Dispense Refill  . atorvastatin (LIPITOR) 20 MG tablet TAKE 1 TABLET DAILY AT 6 P.M. (NEEDS TO BE SEEN BY PRIMARY CARE PHYSICIAN TO RECEIVE FURTHER REFILLS) 90 tablet 3  . bimatoprost (LUMIGAN) 0.01 % SOLN 1 drop nightly.     . chlorthalidone (HYGROTON) 25 MG tablet Take 25 mg by mouth daily.    . Cholecalciferol 50 MCG (2000 UT) CAPS Take 1 tablet by mouth daily.    . potassium chloride (KLOR-CON) 10 MEQ tablet Take 1 tablet (10 mEq total) by mouth daily. 30 tablet 1  . sildenafil (VIAGRA) 25 MG tablet Take 1 tablet (25 mg total) by mouth daily as needed for erectile dysfunction. 10 tablet 1   Social History   Socioeconomic History  . Marital status: Married    Spouse name: Not on file  . Number of children: Not on file  . Years of education: Not on file  . Highest education level: Not on file  Occupational History  . Not on file  Tobacco Use  . Smoking status:  Never Smoker  . Smokeless tobacco: Never Used  Substance and Sexual Activity  . Alcohol use: Yes    Alcohol/week: 5.0 standard drinks    Types: 5 Standard drinks or equivalent per week    Comment: weekly  . Drug use: No  . Sexual activity: Yes    Partners: Female    Comment: Wife  Other Topics Concern  . Not on file  Social History Narrative   Works as a Chief of Staff ,    Lives with wife, Therapist, sports.    Children- 2    Pets: none currently    Caffeine- No soda, 1 cup of coffee daily    Enjoys keeping up with financial information    Social Determinants of Health   Financial Resource Strain:   . Difficulty of Paying Living Expenses:   Food Insecurity:   . Worried About Charity fundraiser in the Last Year:   . Arboriculturist in the Last Year:   Transportation Needs:   . Film/video editor (Medical):   Marland Kitchen Lack of Transportation (Non-Medical):   Physical Activity:   . Days of Exercise per Week:   . Minutes of Exercise per Session:   Stress:   . Feeling of Stress :   Social Connections:   . Frequency of Communication with Friends and Family:   . Frequency of Social Gatherings  with Friends and Family:   . Attends Religious Services:   . Active Member of Clubs or Organizations:   . Attends Archivist Meetings:   Marland Kitchen Marital Status:   Intimate Partner Violence:   . Fear of Current or Ex-Partner:   . Emotionally Abused:   Marland Kitchen Physically Abused:   . Sexually Abused:    Family History  Problem Relation Age of Onset  . Cancer Paternal Uncle        Prostate  . Diabetes Brother        Type I  . Dementia Mother   . Stroke Mother   . COPD Father   . Hypertension Father   . Heart disease Father   . Kidney cancer Neg Hx   . Bladder Cancer Neg Hx     OBJECTIVE:  Vitals:   12/04/19 0834 12/04/19 0836  BP: 123/79   Pulse: 62   Resp: 18   Temp: 98.6 F (37 C)   TempSrc: Oral   SpO2: 96%   Weight:  187 lb (84.8 kg)  Height:  6\' 2"  (1.88 m)     General  appearance: alert; appears fatigued HEENT: Ears: bilateral EACs with cerumen impaction,  Eyes: PERRL, EOMI grossly; Sinuses nontender to palpation; Nose: clear rhinorrhea; Throat: oropharynx mildly erythematous, tonsils 1+ without white tonsillar exudates, uvula midline Neck: supple without LAD Lungs: unlabored respirations, symmetrical air entry; cough: absent; no respiratory distress Heart: regular rate and rhythm.  Radial pulses 2+ symmetrical bilaterally Skin: warm and dry Psychological: alert and cooperative; normal mood and affect  Imaging: No results found.   ASSESSMENT & PLAN:  1. Bilateral impacted cerumen   2. Ear fullness, bilateral     No orders of the defined types were placed in this encounter.    Ear irrigation in office today. May use 1/2 peroxide, 1/2 water solution to clean ears 1-2 times per week to help prevent recurrence. Rest and drink plenty of fluids Continue to use OTC ibuprofen and/ or tylenol as needed for pain control Follow up with PCP if symptoms persists Return here or go to the ER if you have any new or worsening symptoms   Reviewed expectations re: course of current medical issues. Questions answered. Outlined signs and symptoms indicating need for more acute intervention. Patient verbalized understanding. After Visit Summary given.          Faustino Congress, NP 12/04/19 1045

## 2019-12-17 DIAGNOSIS — N400 Enlarged prostate without lower urinary tract symptoms: Secondary | ICD-10-CM | POA: Diagnosis not present

## 2019-12-17 DIAGNOSIS — R972 Elevated prostate specific antigen [PSA]: Secondary | ICD-10-CM | POA: Diagnosis not present

## 2020-02-20 DIAGNOSIS — R972 Elevated prostate specific antigen [PSA]: Secondary | ICD-10-CM | POA: Diagnosis not present

## 2020-05-05 ENCOUNTER — Other Ambulatory Visit: Payer: Self-pay | Admitting: Family

## 2020-05-05 DIAGNOSIS — N529 Male erectile dysfunction, unspecified: Secondary | ICD-10-CM

## 2020-06-09 DIAGNOSIS — I10 Essential (primary) hypertension: Secondary | ICD-10-CM | POA: Diagnosis not present

## 2020-09-03 DIAGNOSIS — R972 Elevated prostate specific antigen [PSA]: Secondary | ICD-10-CM | POA: Diagnosis not present

## 2020-09-03 DIAGNOSIS — Z87898 Personal history of other specified conditions: Secondary | ICD-10-CM | POA: Diagnosis not present

## 2020-09-20 ENCOUNTER — Other Ambulatory Visit: Payer: Self-pay | Admitting: Family

## 2020-09-20 DIAGNOSIS — N529 Male erectile dysfunction, unspecified: Secondary | ICD-10-CM

## 2020-09-27 ENCOUNTER — Encounter: Payer: Self-pay | Admitting: Family

## 2020-09-29 ENCOUNTER — Ambulatory Visit (INDEPENDENT_AMBULATORY_CARE_PROVIDER_SITE_OTHER): Payer: Medicare Other | Admitting: Family

## 2020-09-29 ENCOUNTER — Other Ambulatory Visit: Payer: Self-pay

## 2020-09-29 ENCOUNTER — Encounter: Payer: Self-pay | Admitting: Family

## 2020-09-29 VITALS — BP 122/82 | HR 78 | Temp 97.7°F | Ht 74.0 in | Wt 188.0 lb

## 2020-09-29 DIAGNOSIS — I10 Essential (primary) hypertension: Secondary | ICD-10-CM | POA: Diagnosis not present

## 2020-09-29 DIAGNOSIS — D696 Thrombocytopenia, unspecified: Secondary | ICD-10-CM

## 2020-09-29 DIAGNOSIS — E785 Hyperlipidemia, unspecified: Secondary | ICD-10-CM

## 2020-09-29 DIAGNOSIS — R972 Elevated prostate specific antigen [PSA]: Secondary | ICD-10-CM | POA: Diagnosis not present

## 2020-09-29 DIAGNOSIS — Z1211 Encounter for screening for malignant neoplasm of colon: Secondary | ICD-10-CM | POA: Diagnosis not present

## 2020-09-29 LAB — CBC WITH DIFFERENTIAL/PLATELET
Basophils Absolute: 0 10*3/uL (ref 0.0–0.1)
Basophils Relative: 0.2 % (ref 0.0–3.0)
Eosinophils Absolute: 0.1 10*3/uL (ref 0.0–0.7)
Eosinophils Relative: 0.8 % (ref 0.0–5.0)
HCT: 45.7 % (ref 39.0–52.0)
Hemoglobin: 16.2 g/dL (ref 13.0–17.0)
Lymphocytes Relative: 23.7 % (ref 12.0–46.0)
Lymphs Abs: 1.5 10*3/uL (ref 0.7–4.0)
MCHC: 35.5 g/dL (ref 30.0–36.0)
MCV: 92 fl (ref 78.0–100.0)
Monocytes Absolute: 0.6 10*3/uL (ref 0.1–1.0)
Monocytes Relative: 9.6 % (ref 3.0–12.0)
Neutro Abs: 4 10*3/uL (ref 1.4–7.7)
Neutrophils Relative %: 65.7 % (ref 43.0–77.0)
Platelets: 99 10*3/uL — ABNORMAL LOW (ref 150.0–400.0)
RBC: 4.97 Mil/uL (ref 4.22–5.81)
RDW: 14.1 % (ref 11.5–15.5)
WBC: 6.1 10*3/uL (ref 4.0–10.5)

## 2020-09-29 LAB — COMPREHENSIVE METABOLIC PANEL
ALT: 12 U/L (ref 0–53)
AST: 14 U/L (ref 0–37)
Albumin: 4.1 g/dL (ref 3.5–5.2)
Alkaline Phosphatase: 51 U/L (ref 39–117)
BUN: 15 mg/dL (ref 6–23)
CO2: 30 mEq/L (ref 19–32)
Calcium: 9.5 mg/dL (ref 8.4–10.5)
Chloride: 103 mEq/L (ref 96–112)
Creatinine, Ser: 1.03 mg/dL (ref 0.40–1.50)
GFR: 72.91 mL/min (ref >60.00)
Glucose, Bld: 109 mg/dL — ABNORMAL HIGH (ref 70–99)
Potassium: 4.3 mEq/L (ref 3.5–5.1)
Sodium: 139 mEq/L (ref 135–145)
Total Bilirubin: 1.1 mg/dL (ref 0.2–1.2)
Total Protein: 6.6 g/dL (ref 6.0–8.3)

## 2020-09-29 LAB — LIPID PANEL
Cholesterol: 186 mg/dL (ref 0–200)
HDL: 41.3 mg/dL (ref >39.00)
LDL Cholesterol: 107 mg/dL — ABNORMAL HIGH (ref 0–99)
NonHDL: 144.9
Total CHOL/HDL Ratio: 5
Triglycerides: 188 mg/dL — ABNORMAL HIGH (ref 0.0–149.0)
VLDL: 37.6 mg/dL (ref 0.0–40.0)

## 2020-09-29 LAB — TSH: TSH: 1.74 u[IU]/mL (ref 0.35–4.50)

## 2020-09-29 NOTE — Assessment & Plan Note (Signed)
Excellent control. Continue chlorthalidone 25mg  and potassium chloride 10 meq qd.

## 2020-09-29 NOTE — Patient Instructions (Signed)
Referral for colonoscopy  Let us know if you dont hear back within a week in regards to an appointment being scheduled.    Nice to see you!

## 2020-09-29 NOTE — Assessment & Plan Note (Signed)
Pending cholesterol panel.

## 2020-09-29 NOTE — Progress Notes (Signed)
Subjective:    Patient ID: Brett Bradshaw, male    DOB: [birth year], 70 y.o.   MRN: [id number]  CC: Brett Bradshaw is a 70 y.o. male who presents today for follow up.   HPI: Feels well today No complaints.   HTN- compliant with chlorthalidone 25mg  and potassium chloride 10 meq qd. No cp, sob, leg swelling.   He is no longer on statin and would prefer to manage with lifestyle.    Oncology follow up, Brett Schaumann, PA- 09/03/20 PSA returned to normal. Plan to repeat PSA in 6 months time.  MRI prostate 12/2019- Changes of severe asymmetric benign prostatic hyperplasia predominantly  affecting the left transition zone more so than the right with areas of  underlying hemorrhage  Cardiology Dr Brett Bradshaw 05/2020. No changes to regimen.   Reports colonoscopy 5 years ago HISTORY:  Past Medical History:  Diagnosis Date  . Chicken pox   . Glaucoma    Bilateral eyes  . Hypertension   . Sarcoidosis 1996   Past Surgical History:  Procedure Laterality Date  . pyloric stenosis     newborn   Family History  Problem Relation Age of Onset  . Cancer Paternal Uncle        Prostate  . Diabetes Brother        Type I  . Dementia Mother   . Stroke Mother   . COPD Father   . Hypertension Father   . Heart disease Father   . Kidney cancer Neg Hx   . Bladder Cancer Neg Hx     Allergies: Patient has no known allergies. Current Outpatient Medications on File Prior to Visit  Medication Sig Dispense Refill  . bimatoprost (LUMIGAN) 0.01 % SOLN 1 drop nightly.     . chlorthalidone (HYGROTON) 25 MG tablet Take 25 mg by mouth daily.    . Cholecalciferol 50 MCG (2000 UT) CAPS Take 1 tablet by mouth daily.    . potassium chloride (KLOR-CON) 10 MEQ tablet Take 1 tablet (10 mEq total) by mouth daily. 30 tablet 1  . sildenafil (VIAGRA) 25 MG tablet TAKE (1) TABLET BY MOUTH ONCE DAILY AS NEEDED FOR ERECTILE DYSFUNCTION. 10 tablet 0   No current facility-administered medications on file prior to visit.     Social History   Tobacco Use  . Smoking status: Never Smoker  . Smokeless tobacco: Never Used  Vaping Use  . Vaping Use: Never used  Substance Use Topics  . Alcohol use: Yes    Alcohol/week: 5.0 standard drinks    Types: 5 Standard drinks or equivalent per week    Comment: weekly  . Drug use: No    Review of Systems  Constitutional: Negative for chills and fever.  Respiratory: Negative for cough.   Cardiovascular: Negative for chest pain, palpitations and leg swelling.  Gastrointestinal: Negative for nausea and vomiting.      Objective:    BP 122/82   Pulse 78   Temp 97.7 F (36.5 C)   Ht 6\' 2"  (1.88 m)   Wt 188 lb (85.3 kg)   SpO2 99%   BMI 24.14 kg/m  BP Readings from Last 3 Encounters:  09/29/20 122/82  12/04/19 123/79  09/29/19 122/70   Wt Readings from Last 3 Encounters:  09/29/20 188 lb (85.3 kg)  12/04/19 187 lb (84.8 kg)  09/29/19 188 lb 12.8 oz (85.6 kg)    Physical Exam Vitals reviewed.  Constitutional:      Appearance: He is well-developed.  Cardiovascular:  Rate and Rhythm: Regular rhythm.     Heart sounds: Normal heart sounds.  Pulmonary:     Effort: Pulmonary effort is normal. No respiratory distress.     Breath sounds: Normal breath sounds. No wheezing, rhonchi or rales.  Skin:    General: Skin is warm and dry.  Neurological:     Mental Status: He is alert.  Psychiatric:        Speech: Speech normal.        Behavior: Behavior normal.        Assessment & Plan:   Problem List Items Addressed This Visit      Cardiovascular and Mediastinum   Essential hypertension - Primary    Excellent control. Continue chlorthalidone 25mg  and potassium chloride 10 meq qd.       Relevant Orders   Comprehensive metabolic panel   TSH     Other   Elevated PSA   HLD (hyperlipidemia)    Pending cholesterol panel.       Relevant Orders   Lipid panel   Thrombocytopenia ([place])   Relevant Orders   CBC with Differential/Platelet     Other Visit Diagnoses    Screen for colon cancer       Relevant Orders   Ambulatory referral to Gastroenterology       I have discontinued Brett Bradshaw's atorvastatin. I am also having him maintain his bimatoprost, chlorthalidone, Cholecalciferol, potassium chloride, and sildenafil.   No orders of the defined types were placed in this encounter.   Return precautions given.   Risks, benefits, and alternatives of the medications and treatment plan prescribed today were discussed, and patient expressed understanding.   Education regarding symptom management and diagnosis given to patient on AVS.  Continue to follow with Brett Hawthorne, FNP for routine health maintenance.   Brett Bradshaw and I agreed with plan.   Mable Paris, FNP

## 2020-10-04 ENCOUNTER — Other Ambulatory Visit: Payer: Self-pay

## 2020-10-04 ENCOUNTER — Ambulatory Visit: Payer: Medicare Other

## 2020-10-04 DIAGNOSIS — R899 Unspecified abnormal finding in specimens from other organs, systems and tissues: Secondary | ICD-10-CM

## 2020-11-10 ENCOUNTER — Other Ambulatory Visit: Payer: Medicare Other

## 2020-11-23 ENCOUNTER — Other Ambulatory Visit: Payer: Self-pay | Admitting: Family

## 2020-11-23 DIAGNOSIS — Z79899 Other long term (current) drug therapy: Secondary | ICD-10-CM

## 2020-11-25 ENCOUNTER — Encounter: Payer: Self-pay | Admitting: Family

## 2020-11-25 ENCOUNTER — Other Ambulatory Visit: Payer: Self-pay

## 2020-11-25 MED ORDER — ROSUVASTATIN CALCIUM 20 MG PO TABS: 20.0000 mg | ORAL_TABLET | Freq: Every day | ORAL | 1 refills | Status: DC

## 2021-02-24 DIAGNOSIS — H40053 Ocular hypertension, bilateral: Secondary | ICD-10-CM | POA: Diagnosis not present

## 2021-03-11 DIAGNOSIS — R972 Elevated prostate specific antigen [PSA]: Secondary | ICD-10-CM | POA: Diagnosis not present

## 2021-03-11 DIAGNOSIS — Z87898 Personal history of other specified conditions: Secondary | ICD-10-CM | POA: Diagnosis not present

## 2021-04-04 ENCOUNTER — Other Ambulatory Visit: Payer: Self-pay

## 2021-04-04 ENCOUNTER — Ambulatory Visit (INDEPENDENT_AMBULATORY_CARE_PROVIDER_SITE_OTHER): Payer: Medicare Other | Admitting: Family

## 2021-04-04 VITALS — BP 108/62 | HR 58 | Temp 98.6°F | Ht 74.0 in | Wt 186.6 lb

## 2021-04-04 DIAGNOSIS — E785 Hyperlipidemia, unspecified: Secondary | ICD-10-CM

## 2021-04-04 DIAGNOSIS — Z23 Encounter for immunization: Secondary | ICD-10-CM | POA: Diagnosis not present

## 2021-04-04 DIAGNOSIS — I10 Essential (primary) hypertension: Secondary | ICD-10-CM | POA: Diagnosis not present

## 2021-04-04 NOTE — Assessment & Plan Note (Signed)
Chronic, excellent control.  Continue  chlorthalidone 25mg  and potassium chloride 10 meq qd.

## 2021-04-04 NOTE — Patient Instructions (Addendum)
Call [facility] for colonoscopy.  Please let me know if there are any issues in scheduling this.  The referral is active.  [place] to see you!

## 2021-04-04 NOTE — Assessment & Plan Note (Signed)
Anticipate improvement.  He has resume Crestor 20 mg.  Pending lipid panel

## 2021-04-04 NOTE — Progress Notes (Signed)
Subjective:    Patient ID: Brett Bradshaw, male    DOB: [birth year], 70 y.o.   MRN: [id number]  CC: Brett Bradshaw is a 70 y.o. male who presents today for follow up.   HPI: Feels well today No complaints   He resumed crestor '20mg'$    Due colonoscopy with letter mailed 02/2021  HTN- compliant with  chlorthalidone '25mg'$  and potassium chloride 10 meq qd.  He remains active.  Denies chest pain, shortness of breath  Following with [place] elevated PSA with Brett Bradshaw.  Last seen March 11, 2021.  He will return in 1 year for PSA and DRE. HISTORY:  Past Medical History:  Diagnosis Date   Chicken pox    Glaucoma    Bilateral eyes   Hypertension    Sarcoidosis 1996   Past Surgical History:  Procedure Laterality Date   pyloric stenosis     newborn   Family History  Problem Relation Age of Onset   Cancer Paternal Uncle        Prostate   Diabetes Brother        Type I   Dementia Mother    Stroke Mother    COPD Father    Hypertension Father    Heart disease Father    Kidney cancer Neg Hx    Bladder Cancer Neg Hx     Allergies: Patient has no known allergies. Current Outpatient Medications on File Prior to Visit  Medication Sig Dispense Refill   bimatoprost (LUMIGAN) 0.01 % SOLN 1 drop nightly.      chlorthalidone (HYGROTON) 25 MG tablet Take 25 mg by mouth daily.     Cholecalciferol 50 MCG (2000 UT) CAPS Take 1 tablet by mouth daily.     potassium chloride (KLOR-CON) 10 MEQ tablet Take 1 tablet (10 mEq total) by mouth daily. 30 tablet 1   rosuvastatin (CRESTOR) 20 MG tablet Take 1 tablet (20 mg total) by mouth daily. 90 tablet 1   sildenafil (VIAGRA) 25 MG tablet TAKE (1) TABLET BY MOUTH ONCE DAILY AS NEEDED FOR ERECTILE DYSFUNCTION. 10 tablet 0   No current facility-administered medications on file prior to visit.    Social History   Tobacco Use   Smoking status: Never   Smokeless tobacco: Never  Vaping Use   Vaping Use: Never used  Substance Use  Topics   Alcohol use: Yes    Alcohol/week: 5.0 standard drinks    Types: 5 Standard drinks or equivalent per week    Comment: weekly   Drug use: No    Review of Systems  Constitutional:  Negative for chills and fever.  HENT:  Negative for congestion, ear pain, rhinorrhea, sinus pressure and sore throat.   Respiratory:  Negative for cough, shortness of breath and wheezing.   Cardiovascular:  Negative for chest pain and palpitations.  Gastrointestinal:  Negative for diarrhea, nausea and vomiting.  Genitourinary:  Negative for dysuria.  Musculoskeletal:  Negative for myalgias.  Skin:  Negative for rash.  Neurological:  Negative for headaches.  Hematological:  Negative for adenopathy.     Objective:    BP 108/62 (BP Location: Left Arm, Patient Position: Sitting, Cuff Size: Large)   Pulse (!) 58   Temp 98.6 F (37 C) (Oral)   Ht '6\' 2"'$  (1.88 m)   Wt 186 lb 9.6 oz (84.6 kg)   SpO2 98%   BMI 23.96 kg/m  BP Readings from Last 3 Encounters:  04/04/21 108/62  09/29/20 122/82  12/04/19 123/79  Wt Readings from Last 3 Encounters:  04/04/21 186 lb 9.6 oz (84.6 kg)  09/29/20 188 lb (85.3 kg)  12/04/19 187 lb (84.8 kg)    Physical Exam Vitals reviewed.  Constitutional:      Appearance: He is well-developed.  Cardiovascular:     Rate and Rhythm: Regular rhythm.     Heart sounds: Normal heart sounds.  Pulmonary:     Effort: Pulmonary effort is normal. No respiratory distress.     Breath sounds: Normal breath sounds. No wheezing, rhonchi or rales.  Skin:    General: Skin is warm and dry.  Neurological:     Mental Status: He is alert.  Psychiatric:        Speech: Speech normal.        Behavior: Behavior normal.       Assessment & Plan:   Problem List Items Addressed This Visit       Cardiovascular and Mediastinum   Essential hypertension - Primary    Chronic, excellent control.  Continue  chlorthalidone '25mg'$  and potassium chloride 10 meq qd.      Relevant Orders    Basic metabolic panel   CBC with Differential/Platelet   Lipid panel     Other   HLD (hyperlipidemia)    Anticipate improvement.  He has resume Crestor 20 mg.  Pending lipid panel      Other Visit Diagnoses     Need for immunization against influenza       Relevant Orders   Flu Vaccine QUAD High Dose(Fluad) (Completed)       Note: Patient will call to schedule colonoscopy.  Phone number was provided today  I am having Brett Bradshaw maintain his bimatoprost, chlorthalidone, Cholecalciferol, potassium chloride, sildenafil, and rosuvastatin.   No orders of the defined types were placed in this encounter.   Return precautions given.   Risks, benefits, and alternatives of the medications and treatment plan prescribed today were discussed, and patient expressed understanding.   Education regarding symptom management and diagnosis given to patient on AVS.  Continue to follow with Burnard Hawthorne, FNP for routine health maintenance.   Brett Bradshaw and I agreed with plan.   Brett Paris, FNP

## 2021-04-19 ENCOUNTER — Other Ambulatory Visit: Payer: Medicare Other

## 2021-04-28 ENCOUNTER — Other Ambulatory Visit: Payer: Self-pay

## 2021-04-28 ENCOUNTER — Other Ambulatory Visit (INDEPENDENT_AMBULATORY_CARE_PROVIDER_SITE_OTHER): Payer: Medicare Other

## 2021-04-28 DIAGNOSIS — I10 Essential (primary) hypertension: Secondary | ICD-10-CM

## 2021-04-28 LAB — BASIC METABOLIC PANEL
BUN: 20 mg/dL (ref 6–23)
CO2: 31 mEq/L (ref 19–32)
Calcium: 9.8 mg/dL (ref 8.4–10.5)
Chloride: 101 mEq/L (ref 96–112)
Creatinine, Ser: 1.14 mg/dL (ref 0.40–1.50)
GFR: 64.29 mL/min (ref >60.00)
Glucose, Bld: 126 mg/dL — ABNORMAL HIGH (ref 70–99)
Potassium: 2.9 mEq/L — ABNORMAL LOW (ref 3.5–5.1)
Sodium: 141 mEq/L (ref 135–145)

## 2021-04-28 LAB — LIPID PANEL
Cholesterol: 144 mg/dL (ref 0–200)
HDL: 47.6 mg/dL (ref >39.00)
LDL Cholesterol: 68 mg/dL (ref 0–99)
NonHDL: 96.4
Total CHOL/HDL Ratio: 3
Triglycerides: 144 mg/dL (ref 0.0–149.0)
VLDL: 28.8 mg/dL (ref 0.0–40.0)

## 2021-04-28 LAB — CBC WITH DIFFERENTIAL/PLATELET
Basophils Absolute: 0 10*3/uL (ref 0.0–0.1)
Basophils Relative: 0.3 % (ref 0.0–3.0)
Eosinophils Absolute: 0.3 10*3/uL (ref 0.0–0.7)
Eosinophils Relative: 5.2 % — ABNORMAL HIGH (ref 0.0–5.0)
HCT: 44.6 % (ref 39.0–52.0)
Hemoglobin: 15.7 g/dL (ref 13.0–17.0)
Lymphocytes Relative: 26.9 % (ref 12.0–46.0)
Lymphs Abs: 1.6 10*3/uL (ref 0.7–4.0)
MCHC: 35.1 g/dL (ref 30.0–36.0)
MCV: 91 fl (ref 78.0–100.0)
Monocytes Absolute: 0.4 10*3/uL (ref 0.1–1.0)
Monocytes Relative: 7.2 % (ref 3.0–12.0)
Neutro Abs: 3.7 10*3/uL (ref 1.4–7.7)
Neutrophils Relative %: 60.4 % (ref 43.0–77.0)
Platelets: 92 10*3/uL — ABNORMAL LOW (ref 150.0–400.0)
RBC: 4.9 Mil/uL (ref 4.22–5.81)
RDW: 14.4 % (ref 11.5–15.5)
WBC: 6.1 10*3/uL (ref 4.0–10.5)

## 2021-04-29 ENCOUNTER — Other Ambulatory Visit: Payer: Self-pay

## 2021-04-29 ENCOUNTER — Telehealth: Payer: Self-pay | Admitting: Family

## 2021-04-29 DIAGNOSIS — I10 Essential (primary) hypertension: Secondary | ICD-10-CM

## 2021-04-29 MED ORDER — POTASSIUM CHLORIDE ER 10 MEQ PO TBCR: EXTENDED_RELEASE_TABLET | ORAL | 0 refills | Status: DC

## 2021-04-29 NOTE — Telephone Encounter (Signed)
I spoke with patient & stated that he was feeling great. No chest pain, no palpitation, no diarrhea or vomiting. He is taking his chlorthalidone 25 mg & potassium 10 MEQ daily. I told him once I knew that new dose he should be taking I would send in & call him to let him know. Pt does know signs & symptoms to look out for with low potassium & know to proceed to ED or he shows any of the signs/symptoms.

## 2021-04-29 NOTE — Telephone Encounter (Signed)
Call patient He will need to take potassium 10 mg M EQ  TID for 2 days and then resume potassium 10 meq BID going forward  Please change prescription to reflect  potassium 10 meq BID   Order BMP and schedule Monday-tues next week

## 2021-04-29 NOTE — Telephone Encounter (Signed)
Call pt  Potassium is extremely low at 2.9.  Please confirm that he is taking chlorthalidone 25 mg daily.  Is he taking potassium 10 M EQ daily as well?   is he experiencing any vomiting, diarrhea which would otherwise cause him to be volume depleted?  Please ensure that he feels well and he is not experiencing any palpitations or chest pain, if he was as low potassium can cause lethal cardiac arrhythmias, I would advise him to go to emergency room for surveillance and frequent lab monitoring.    If he feels well, please advised that we will increase his potassium once you have  spoken with him to confirm how frequently he is taking

## 2021-04-29 NOTE — Telephone Encounter (Signed)
I spoke with patient & let him know new potassium dosing. I have sent to his pharmacy & patient has verbalized understanding.

## 2021-04-30 ENCOUNTER — Other Ambulatory Visit: Payer: Self-pay | Admitting: Family

## 2021-04-30 DIAGNOSIS — N529 Male erectile dysfunction, unspecified: Secondary | ICD-10-CM

## 2021-05-03 ENCOUNTER — Other Ambulatory Visit: Payer: Self-pay

## 2021-05-03 DIAGNOSIS — R899 Unspecified abnormal finding in specimens from other organs, systems and tissues: Secondary | ICD-10-CM

## 2021-05-23 ENCOUNTER — Other Ambulatory Visit: Payer: Self-pay | Admitting: Family

## 2021-05-26 ENCOUNTER — Other Ambulatory Visit (INDEPENDENT_AMBULATORY_CARE_PROVIDER_SITE_OTHER): Payer: Medicare Other

## 2021-05-26 ENCOUNTER — Other Ambulatory Visit: Payer: Self-pay

## 2021-05-26 DIAGNOSIS — R899 Unspecified abnormal finding in specimens from other organs, systems and tissues: Secondary | ICD-10-CM | POA: Diagnosis not present

## 2021-05-26 LAB — COMPREHENSIVE METABOLIC PANEL
ALT: 16 U/L (ref 0–53)
AST: 21 U/L (ref 0–37)
Albumin: 4.5 g/dL (ref 3.5–5.2)
Alkaline Phosphatase: 44 U/L (ref 39–117)
BUN: 17 mg/dL (ref 6–23)
CO2: 32 mEq/L (ref 19–32)
Calcium: 9.3 mg/dL (ref 8.4–10.5)
Chloride: 100 mEq/L (ref 96–112)
Creatinine, Ser: 1.17 mg/dL (ref 0.40–1.50)
GFR: 62.28 mL/min (ref >60.00)
Glucose, Bld: 108 mg/dL — ABNORMAL HIGH (ref 70–99)
Potassium: 3.4 mEq/L — ABNORMAL LOW (ref 3.5–5.1)
Sodium: 138 mEq/L (ref 135–145)
Total Bilirubin: 1.5 mg/dL — ABNORMAL HIGH (ref 0.2–1.2)
Total Protein: 6.6 g/dL (ref 6.0–8.3)

## 2021-05-26 LAB — CBC WITH DIFFERENTIAL/PLATELET
Basophils Absolute: 0 10*3/uL (ref 0.0–0.1)
Basophils Relative: 0.1 % (ref 0.0–3.0)
Eosinophils Absolute: 0.1 10*3/uL (ref 0.0–0.7)
Eosinophils Relative: 1.2 % (ref 0.0–5.0)
HCT: 44.1 % (ref 39.0–52.0)
Hemoglobin: 15.8 g/dL (ref 13.0–17.0)
Lymphocytes Relative: 31.8 % (ref 12.0–46.0)
Lymphs Abs: 1.9 10*3/uL (ref 0.7–4.0)
MCHC: 35.7 g/dL (ref 30.0–36.0)
MCV: 89.6 fl (ref 78.0–100.0)
Monocytes Absolute: 0.5 10*3/uL (ref 0.1–1.0)
Monocytes Relative: 8.8 % (ref 3.0–12.0)
Neutro Abs: 3.4 10*3/uL (ref 1.4–7.7)
Neutrophils Relative %: 58.1 % (ref 43.0–77.0)
Platelets: 92 10*3/uL — ABNORMAL LOW (ref 150.0–400.0)
RBC: 4.92 Mil/uL (ref 4.22–5.81)
RDW: 14.3 % (ref 11.5–15.5)
WBC: 5.9 10*3/uL (ref 4.0–10.5)

## 2021-05-27 ENCOUNTER — Encounter: Payer: Self-pay | Admitting: Family

## 2021-05-30 ENCOUNTER — Other Ambulatory Visit: Payer: Self-pay | Admitting: Family

## 2021-05-30 ENCOUNTER — Encounter: Payer: Self-pay | Admitting: Family

## 2021-05-30 DIAGNOSIS — I10 Essential (primary) hypertension: Secondary | ICD-10-CM

## 2021-05-30 DIAGNOSIS — D696 Thrombocytopenia, unspecified: Secondary | ICD-10-CM

## 2021-05-30 MED ORDER — POTASSIUM CHLORIDE ER 10 MEQ PO TBCR: EXTENDED_RELEASE_TABLET | ORAL | 0 refills | Status: DC

## 2021-05-31 ENCOUNTER — Other Ambulatory Visit: Payer: Self-pay | Admitting: Family

## 2021-05-31 DIAGNOSIS — I10 Essential (primary) hypertension: Secondary | ICD-10-CM

## 2021-06-24 ENCOUNTER — Other Ambulatory Visit: Payer: Self-pay

## 2021-06-24 ENCOUNTER — Other Ambulatory Visit (INDEPENDENT_AMBULATORY_CARE_PROVIDER_SITE_OTHER): Payer: Medicare Other

## 2021-06-24 DIAGNOSIS — I10 Essential (primary) hypertension: Secondary | ICD-10-CM | POA: Diagnosis not present

## 2021-06-24 DIAGNOSIS — D696 Thrombocytopenia, unspecified: Secondary | ICD-10-CM

## 2021-06-24 NOTE — Addendum Note (Signed)
Addended by: Leeanne Rio on: 06/24/2021 02:19 PM   Modules accepted: Orders

## 2021-06-25 LAB — COMPREHENSIVE METABOLIC PANEL
ALT: 14 IU/L (ref 0–44)
AST: 18 IU/L (ref 0–40)
Albumin/Globulin Ratio: 2.2 (ref 1.2–2.2)
Albumin: 4.6 g/dL (ref 3.7–4.7)
Alkaline Phosphatase: 49 IU/L (ref 44–121)
BUN/Creatinine Ratio: 12 (ref 10–24)
BUN: 13 mg/dL (ref 8–27)
Bilirubin Total: 1.1 mg/dL (ref 0.0–1.2)
CO2: 26 mmol/L (ref 20–29)
Calcium: 9.7 mg/dL (ref 8.6–10.2)
Chloride: 101 mmol/L (ref 96–106)
Creatinine, Ser: 1.11 mg/dL (ref 0.76–1.27)
Globulin, Total: 2.1 g/dL (ref 1.5–4.5)
Glucose: 99 mg/dL (ref 70–99)
Potassium: 3.3 mmol/L — ABNORMAL LOW (ref 3.5–5.2)
Sodium: 141 mmol/L (ref 134–144)
Total Protein: 6.7 g/dL (ref 6.0–8.5)
eGFR: 71 mL/min/{1.73_m2} (ref >59)

## 2021-06-27 ENCOUNTER — Other Ambulatory Visit: Payer: Self-pay | Admitting: Family

## 2021-06-27 ENCOUNTER — Other Ambulatory Visit: Payer: Self-pay

## 2021-06-27 ENCOUNTER — Encounter: Payer: Self-pay | Admitting: Family

## 2021-06-27 DIAGNOSIS — I10 Essential (primary) hypertension: Secondary | ICD-10-CM

## 2021-06-27 DIAGNOSIS — R899 Unspecified abnormal finding in specimens from other organs, systems and tissues: Secondary | ICD-10-CM

## 2021-06-27 MED ORDER — POTASSIUM CHLORIDE CRYS ER 20 MEQ PO TBCR: 20.0000 meq | EXTENDED_RELEASE_TABLET | Freq: Two times a day (BID) | ORAL | 1 refills | Status: DC

## 2021-06-28 ENCOUNTER — Other Ambulatory Visit: Payer: Self-pay | Admitting: Family

## 2021-06-28 DIAGNOSIS — I10 Essential (primary) hypertension: Secondary | ICD-10-CM

## 2021-07-06 ENCOUNTER — Other Ambulatory Visit
Admission: RE | Admit: 2021-07-06 | Discharge: 2021-07-06 | Disposition: A | Discharge status: Home / Self Care | Payer: Medicare Other | Attending: Family | Admitting: Family

## 2021-07-06 DIAGNOSIS — R899 Unspecified abnormal finding in specimens from other organs, systems and tissues: Secondary | ICD-10-CM | POA: Insufficient documentation

## 2021-07-06 LAB — BASIC METABOLIC PANEL
Anion gap: 7 (ref 5–15)
BUN: 20 mg/dL (ref 8–23)
CO2: 31 mmol/L (ref 22–32)
Calcium: 9.4 mg/dL (ref 8.9–10.3)
Chloride: 99 mmol/L (ref 98–111)
Creatinine, Ser: 1 mg/dL (ref 0.61–1.24)
GFR, Estimated: >60 mL/min (ref >60)
Glucose, Bld: 110 mg/dL — ABNORMAL HIGH (ref 70–99)
Potassium: 3.4 mmol/L — ABNORMAL LOW (ref 3.5–5.1)
Sodium: 137 mmol/L (ref 135–145)

## 2021-07-06 LAB — BILIRUBIN, FRACTIONATED(TOT/DIR/INDIR)
Bilirubin, Direct: 0.3 mg/dL — ABNORMAL HIGH (ref 0.0–0.2)
Indirect Bilirubin: 1.7 mg/dL — ABNORMAL HIGH (ref 0.3–0.9)
Total Bilirubin: 2 mg/dL — ABNORMAL HIGH (ref 0.3–1.2)

## 2021-07-08 ENCOUNTER — Telehealth: Payer: Self-pay

## 2021-07-08 ENCOUNTER — Other Ambulatory Visit: Payer: Self-pay | Admitting: Family

## 2021-07-08 ENCOUNTER — Encounter: Payer: Self-pay | Admitting: Family

## 2021-07-08 DIAGNOSIS — I10 Essential (primary) hypertension: Secondary | ICD-10-CM

## 2021-07-08 DIAGNOSIS — E876 Hypokalemia: Secondary | ICD-10-CM

## 2021-07-08 DIAGNOSIS — R17 Unspecified jaundice: Secondary | ICD-10-CM

## 2021-07-08 MED ORDER — POTASSIUM CHLORIDE CRYS ER 20 MEQ PO TBCR: EXTENDED_RELEASE_TABLET | ORAL | 0 refills | Status: DC

## 2021-07-08 NOTE — Telephone Encounter (Signed)
Ordered lab and sent potassium rx to pharmacy.

## 2021-07-13 ENCOUNTER — Ambulatory Visit
Admission: RE | Admit: 2021-07-13 | Discharge: 2021-07-13 | Disposition: A | Discharge status: Home / Self Care | Payer: Medicare Other | Source: Ambulatory Visit | Attending: Family | Admitting: Family

## 2021-07-13 ENCOUNTER — Other Ambulatory Visit: Payer: Self-pay

## 2021-07-13 DIAGNOSIS — R17 Unspecified jaundice: Secondary | ICD-10-CM | POA: Diagnosis not present

## 2021-07-14 ENCOUNTER — Encounter: Payer: Self-pay | Admitting: Family

## 2021-07-14 ENCOUNTER — Telehealth: Payer: Self-pay | Admitting: Family

## 2021-07-14 DIAGNOSIS — I10 Essential (primary) hypertension: Secondary | ICD-10-CM

## 2021-07-14 NOTE — Telephone Encounter (Signed)
Patient has a lab appt 07/22/2020, there are no orders in.

## 2021-07-15 NOTE — Addendum Note (Signed)
Addended by: Burnard Hawthorne on: 07/15/2021 08:51 AM   Modules accepted: Orders

## 2021-07-22 ENCOUNTER — Other Ambulatory Visit: Payer: Self-pay

## 2021-07-22 ENCOUNTER — Other Ambulatory Visit (INDEPENDENT_AMBULATORY_CARE_PROVIDER_SITE_OTHER): Payer: Medicare Other

## 2021-07-22 ENCOUNTER — Other Ambulatory Visit: Payer: Medicare Other

## 2021-07-22 DIAGNOSIS — I10 Essential (primary) hypertension: Secondary | ICD-10-CM | POA: Diagnosis not present

## 2021-07-22 LAB — BASIC METABOLIC PANEL
BUN: 21 mg/dL (ref 6–23)
CO2: 30 mEq/L (ref 19–32)
Calcium: 9.4 mg/dL (ref 8.4–10.5)
Chloride: 100 mEq/L (ref 96–112)
Creatinine, Ser: 1.23 mg/dL (ref 0.40–1.50)
GFR: 58.59 mL/min — ABNORMAL LOW (ref >60.00)
Glucose, Bld: 102 mg/dL — ABNORMAL HIGH (ref 70–99)
Potassium: 3.7 mEq/L (ref 3.5–5.1)
Sodium: 138 mEq/L (ref 135–145)

## 2021-07-25 ENCOUNTER — Other Ambulatory Visit: Payer: Self-pay

## 2021-07-25 DIAGNOSIS — L821 Other seborrheic keratosis: Secondary | ICD-10-CM | POA: Diagnosis not present

## 2021-07-25 DIAGNOSIS — L304 Erythema intertrigo: Secondary | ICD-10-CM | POA: Diagnosis not present

## 2021-07-25 DIAGNOSIS — Z85828 Personal history of other malignant neoplasm of skin: Secondary | ICD-10-CM | POA: Diagnosis not present

## 2021-07-25 DIAGNOSIS — D2270 Melanocytic nevi of unspecified lower limb, including hip: Secondary | ICD-10-CM | POA: Diagnosis not present

## 2021-07-25 DIAGNOSIS — E876 Hypokalemia: Secondary | ICD-10-CM

## 2021-07-25 DIAGNOSIS — L814 Other melanin hyperpigmentation: Secondary | ICD-10-CM | POA: Diagnosis not present

## 2021-07-25 DIAGNOSIS — R17 Unspecified jaundice: Secondary | ICD-10-CM

## 2021-07-25 DIAGNOSIS — D1801 Hemangioma of skin and subcutaneous tissue: Secondary | ICD-10-CM | POA: Diagnosis not present

## 2021-07-25 DIAGNOSIS — D2260 Melanocytic nevi of unspecified upper limb, including shoulder: Secondary | ICD-10-CM | POA: Diagnosis not present

## 2021-07-25 DIAGNOSIS — D225 Melanocytic nevi of trunk: Secondary | ICD-10-CM | POA: Diagnosis not present

## 2021-08-15 ENCOUNTER — Other Ambulatory Visit: Payer: Self-pay | Admitting: Family

## 2021-08-15 DIAGNOSIS — N529 Male erectile dysfunction, unspecified: Secondary | ICD-10-CM

## 2021-09-02 DIAGNOSIS — H40053 Ocular hypertension, bilateral: Secondary | ICD-10-CM | POA: Diagnosis not present

## 2021-09-12 ENCOUNTER — Telehealth: Payer: Self-pay

## 2021-09-12 ENCOUNTER — Ambulatory Visit: Payer: Medicare Other | Admitting: Gastroenterology

## 2021-09-12 NOTE — Telephone Encounter (Signed)
Scheduled for 12/01/2021

## 2021-09-21 DIAGNOSIS — E7800 Pure hypercholesterolemia, unspecified: Secondary | ICD-10-CM | POA: Diagnosis not present

## 2021-09-21 DIAGNOSIS — I10 Essential (primary) hypertension: Secondary | ICD-10-CM | POA: Diagnosis not present

## 2021-09-26 ENCOUNTER — Ambulatory Visit (INDEPENDENT_AMBULATORY_CARE_PROVIDER_SITE_OTHER): Payer: Medicare Other

## 2021-09-26 ENCOUNTER — Telehealth: Payer: Self-pay | Admitting: Family

## 2021-09-26 DIAGNOSIS — Z0000 Encounter for general adult medical examination without abnormal findings: Secondary | ICD-10-CM | POA: Diagnosis not present

## 2021-09-26 NOTE — Patient Instructions (Signed)

## 2021-09-26 NOTE — Telephone Encounter (Signed)
Left message to return call to start 10:30 am telephone wellness visit.  ?

## 2021-09-26 NOTE — Progress Notes (Signed)
Subjective:   Brett Bradshaw is a 71 y.o. male who presents for Medicare Annual/Subsequent preventive examination. I connected with  [name] on 09/26/21 by a audio enabled telemedicine application and verified that I am speaking with the correct person using two identifiers.  Patient Location: Home  Provider Location: Home Office  I discussed the limitations of evaluation and management by telemedicine. The patient expressed understanding and agreed to proceed.   Review of Systems    Defer to PCP.       Objective:    There were no vitals filed for this visit. There is no height or weight on file to calculate BMI.  Advanced Directives 09/29/2019 09/25/2018 09/05/2017  Does Patient Have a Medical Advance Directive? No No No  Would patient like information on creating a medical advance directive? No - Patient declined No - Patient declined No - Patient declined    Current Medications (verified) Outpatient Encounter Medications as of 09/26/2021  Medication Sig   bimatoprost (LUMIGAN) 0.01 % SOLN 1 drop nightly.    chlorthalidone (HYGROTON) 25 MG tablet Take 25 mg by mouth daily.   Cholecalciferol 50 MCG (2000 UT) CAPS Take 1 tablet by mouth daily.   potassium chloride SA (KLOR-CON M) 20 MEQ tablet Monday, Wednesday, Friday you would take 3 tablets. All other days, continue two tablets.   rosuvastatin (CRESTOR) 20 MG tablet TAKE 1 TABLET DAILY   sildenafil (VIAGRA) 25 MG tablet TAKE (1) TABLET BY MOUTH ONCE DAILY AS NEEDED FOR ERECTILE DYSFUNCTION.   No facility-administered encounter medications on file as of 09/26/2021.    Allergies (verified) Patient has no known allergies.   History: Past Medical History:  Diagnosis Date   Chicken pox    Glaucoma    Bilateral eyes   Hypertension    Sarcoidosis 1996   Past Surgical History:  Procedure Laterality Date   pyloric stenosis     newborn   Family History  Problem Relation Age of Onset   Cancer Paternal Uncle         Prostate   Diabetes Brother        Type I   Dementia Mother    Stroke Mother    COPD Father    Hypertension Father    Heart disease Father    Kidney cancer Neg Hx    Bladder Cancer Neg Hx    Social History   Socioeconomic History   Marital status: Married    Spouse name: Not on file   Number of children: Not on file   Years of education: Not on file   Highest education level: Not on file  Occupational History   Not on file  Tobacco Use   Smoking status: Never   Smokeless tobacco: Never  Vaping Use   Vaping Use: Never used  Substance and Sexual Activity   Alcohol use: Yes    Alcohol/week: 5.0 standard drinks    Types: 5 Standard drinks or equivalent per week    Comment: weekly   Drug use: No   Sexual activity: Yes    Partners: Female    Comment: Wife  Other Topics Concern   Not on file  Social History Narrative   Works as a Chief of Staff ,    Lives with wife, Therapist, sports.    Children- 2    Pets: none currently    Caffeine- No soda, 1 cup of coffee daily    Enjoys keeping up with financial information    Social Determinants of Health  Financial Resource Strain: Not on file  Food Insecurity: Not on file  Transportation Needs: Not on file  Physical Activity: Not on file  Stress: Not on file  Social Connections: Not on file    Tobacco Counseling Counseling given: Not Answered   Clinical Intake:                 Diabetic?No         Activities of Daily Living No flowsheet data found.  Patient Care Team: Burnard Hawthorne, FNP as PCP - General (Family Medicine)  Indicate any recent Medical Services you may have received from other than [facility] providers in the past year (date may be approximate).     Assessment:   This is a routine wellness examination for Brett Bradshaw.  Hearing/Vision screen No results found.  Dietary issues and exercise activities discussed:     Goals Addressed   None   Depression Screen PHQ 2/9 Scores 09/29/2020  09/29/2019 09/25/2018 09/05/2017 09/11/2016 05/16/2016 09/09/2015  PHQ - 2 Score 0 0 0 0 0 0 0    Fall Risk Fall Risk  09/29/2020 09/29/2019 09/25/2018 09/05/2017 09/11/2016  Falls in the past year? 0 0 0 No No  Follow up Falls evaluation completed Falls evaluation completed - - -    FALL RISK PREVENTION PERTAINING TO THE HOME:  Any stairs in or around the home? Yes  If so, are there any without handrails? Yes  Home free of loose throw rugs in walkways, pet beds, electrical cords, etc? Yes  Adequate lighting in your home to reduce risk of falls? Yes   ASSISTIVE DEVICES UTILIZED TO PREVENT FALLS:  Life alert? No  Use of a cane, walker or w/c? No  Grab bars in the bathroom? No  Shower chair or bench in shower? Yes  Elevated toilet seat or a handicapped toilet? No   TIMED UP AND GO:  Was the test performed? No .  Length of time to ambulate 10 feet: N/A sec.     Cognitive Function: MMSE - Mini Mental State Exam 09/05/2017  Orientation to time 5  Orientation to Place 5  Registration 3  Attention/ Calculation 5  Recall 3  Language- name 2 objects 2  Language- repeat 1  Language- follow 3 step command 3  Language- read & follow direction 1  Write a sentence 1  Copy design 1  Total score 30     6CIT Screen 09/29/2019 09/25/2018  What Year? 0 points 0 points  What month? 0 points 0 points  What time? 0 points 0 points  Count back from 20 - 0 points  Months in reverse - 0 points  Repeat phrase - 0 points  Total Score - 0    Immunizations Immunization History  Administered Date(s) Administered   Fluad Quad(high Dose 65+) 04/04/2021   Influenza, High Dose Seasonal PF 09/05/2017, 09/25/2018   Influenza,inj,Quad PF,6+ Mos 09/09/2015   Influenza-Unspecified 08/17/2017   Pneumococcal Conjugate-13 09/11/2016   Pneumococcal Polysaccharide-23 09/19/2017   Td 09/09/2015    TDAP status: Up to date  Flu Vaccine status: Up to date  Pneumococcal vaccine status: Up to  date  Covid-19 vaccine status: Information provided on how to obtain vaccines.   Qualifies for Shingles Vaccine? Yes   Zostavax completed No   Shingrix Completed?: No.    Education has been provided regarding the importance of this vaccine. Patient has been advised to call insurance company to determine out of pocket expense if they have not yet received  this vaccine. Advised may also receive vaccine at local pharmacy or Health Dept. Verbalized acceptance and understanding.  Screening Tests Health Maintenance  Topic Date Due   COVID-19 Vaccine (1) Never done   Zoster Vaccines- Shingrix (1 of 2) Never done   COLONOSCOPY (Pts 45-70yr Insurance coverage will need to be confirmed)  11/01/2020   TETANUS/TDAP  09/08/2025   Pneumonia Vaccine 71 Years old  Completed   INFLUENZA VACCINE  Completed   Hepatitis C Screening  Completed   HPV VACCINES  Aged Out    Health Maintenance  Health Maintenance Due  Topic Date Due   COVID-19 Vaccine (1) Never done   Zoster Vaccines- Shingrix (1 of 2) Never done   COLONOSCOPY (Pts 45-469yrInsurance coverage will need to be confirmed)  11/01/2020    Colorectal cancer screening: Referral to GI placed 07/25/2021. Pt aware the office will call re: appt.  Lung Cancer Screening: (Low Dose CT Chest recommended if Age 73-80ears, 30 pack-year currently smoking OR have quit w/in 15years.) does not qualify.   Lung Cancer Screening Referral: N/A  Additional Screening:  Hepatitis C Screening: does qualify; Completed 09/11/2016  Vision Screening: Recommended annual ophthalmology exams for early detection of glaucoma and other disorders of the eye. Is the patient up to date with their annual eye exam?  Yes  Who is the provider or what is the name of the office in which the patient attends annual eye exams? [facility] If pt is not established with a provider, would they like to be referred to a provider to establish care?  N/A .   Dental Screening:  Recommended annual dental exams for proper oral hygiene  Community Resource Referral / Chronic Care Management: CRR required this visit?  No   CCM required this visit?  No      Plan:     I have personally reviewed and noted the following in the patients chart:   Medical and social history Use of alcohol, tobacco or illicit drugs  Current medications and supplements including opioid prescriptions. Patient is not currently taking opioid prescriptions. Functional ability and status Nutritional status Physical activity Advanced directives List of other physicians Hospitalizations, surgeries, and ER visits in previous 12 months Vitals Screenings to include cognitive, depression, and falls Referrals and appointments  In addition, I have reviewed and discussed with patient certain preventive protocols, quality metrics, and best practice recommendations. A written personalized care plan for preventive services as well as general preventive health recommendations were provided to patient.     ArThressa ShellerCMEast [place] 09/26/2021   Nurse Notes: Non-Face to Face 16 minute visit.    Brett Bradshaw Thank you for taking time to come for your Medicare Wellness Visit. I appreciate your ongoing commitment to your health goals. Please review the following plan we discussed and let me know if I can assist you in the future.   These are the goals we discussed:  Goals       Maintain Healthy Lifestyle (pt-stated)      Stay proactive regarding over all health        This is a list of the screening recommended for you and due dates:  Health Maintenance  Topic Date Due   COVID-19 Vaccine (1) Never done   Zoster (Shingles) Vaccine (1 of 2) Never done   Colon Cancer Screening  11/01/2020   Tetanus Vaccine  09/08/2025   Pneumonia Vaccine  Completed   Flu Shot  Completed  Hepatitis C Screening: USPSTF Recommendation to screen - Ages 12-79 yo.  Completed   HPV Vaccine  Aged Out

## 2021-09-30 ENCOUNTER — Other Ambulatory Visit: Payer: Medicare Other

## 2021-09-30 DIAGNOSIS — H40053 Ocular hypertension, bilateral: Secondary | ICD-10-CM | POA: Diagnosis not present

## 2021-10-03 ENCOUNTER — Ambulatory Visit (INDEPENDENT_AMBULATORY_CARE_PROVIDER_SITE_OTHER): Payer: Medicare Other | Admitting: Family

## 2021-10-03 ENCOUNTER — Telehealth: Payer: Self-pay

## 2021-10-03 ENCOUNTER — Other Ambulatory Visit: Payer: Self-pay | Admitting: Family

## 2021-10-03 ENCOUNTER — Other Ambulatory Visit: Payer: Self-pay

## 2021-10-03 ENCOUNTER — Encounter: Payer: Self-pay | Admitting: Family

## 2021-10-03 VITALS — BP 122/72 | HR 69 | Temp 96.1°F | Ht 74.0 in | Wt 187.8 lb

## 2021-10-03 DIAGNOSIS — R972 Elevated prostate specific antigen [PSA]: Secondary | ICD-10-CM | POA: Diagnosis not present

## 2021-10-03 DIAGNOSIS — I10 Essential (primary) hypertension: Secondary | ICD-10-CM | POA: Diagnosis not present

## 2021-10-03 DIAGNOSIS — J309 Allergic rhinitis, unspecified: Secondary | ICD-10-CM

## 2021-10-03 DIAGNOSIS — E785 Hyperlipidemia, unspecified: Secondary | ICD-10-CM | POA: Diagnosis not present

## 2021-10-03 LAB — CBC WITH DIFFERENTIAL/PLATELET
Basophils Absolute: 0 10*3/uL (ref 0.0–0.1)
Basophils Relative: 0.2 % (ref 0.0–3.0)
Eosinophils Absolute: 0.1 10*3/uL (ref 0.0–0.7)
Eosinophils Relative: 1.9 % (ref 0.0–5.0)
HCT: 45.3 % (ref 39.0–52.0)
Hemoglobin: 16 g/dL (ref 13.0–17.0)
Lymphocytes Relative: 28.3 % (ref 12.0–46.0)
Lymphs Abs: 1.8 10*3/uL (ref 0.7–4.0)
MCHC: 35.3 g/dL (ref 30.0–36.0)
MCV: 90 fl (ref 78.0–100.0)
Monocytes Absolute: 0.4 10*3/uL (ref 0.1–1.0)
Monocytes Relative: 6.8 % (ref 3.0–12.0)
Neutro Abs: 3.9 10*3/uL (ref 1.4–7.7)
Neutrophils Relative %: 62.8 % (ref 43.0–77.0)
Platelets: 71 10*3/uL — ABNORMAL LOW (ref 150.0–400.0)
RBC: 5.03 Mil/uL (ref 4.22–5.81)
RDW: 14.2 % (ref 11.5–15.5)
WBC: 6.2 10*3/uL (ref 4.0–10.5)

## 2021-10-03 LAB — COMPREHENSIVE METABOLIC PANEL
ALT: 15 U/L (ref 0–53)
AST: 18 U/L (ref 0–37)
Albumin: 4.4 g/dL (ref 3.5–5.2)
Alkaline Phosphatase: 37 U/L — ABNORMAL LOW (ref 39–117)
BUN: 20 mg/dL (ref 6–23)
CO2: 30 mEq/L (ref 19–32)
Calcium: 9.4 mg/dL (ref 8.4–10.5)
Chloride: 103 mEq/L (ref 96–112)
Creatinine, Ser: 1.06 mg/dL (ref 0.40–1.50)
GFR: 69.94 mL/min (ref >60.00)
Glucose, Bld: 118 mg/dL — ABNORMAL HIGH (ref 70–99)
Potassium: 4.2 mEq/L (ref 3.5–5.1)
Sodium: 140 mEq/L (ref 135–145)
Total Bilirubin: 1.3 mg/dL — ABNORMAL HIGH (ref 0.2–1.2)
Total Protein: 6.6 g/dL (ref 6.0–8.3)

## 2021-10-03 LAB — LIPID PANEL
Cholesterol: 147 mg/dL (ref 0–200)
HDL: 54.6 mg/dL (ref >39.00)
LDL Cholesterol: 64 mg/dL (ref 0–99)
NonHDL: 92.11
Total CHOL/HDL Ratio: 3
Triglycerides: 141 mg/dL (ref 0.0–149.0)
VLDL: 28.2 mg/dL (ref 0.0–40.0)

## 2021-10-03 MED ORDER — AZELASTINE HCL 0.1 % NA SOLN: 1.0000 | Freq: Two times a day (BID) | NASAL | 4 refills | Status: DC

## 2021-10-03 NOTE — Assessment & Plan Note (Signed)
Chronic, stable.  Continue Crestor 20 mg ?

## 2021-10-03 NOTE — Patient Instructions (Signed)
Nice to see you!   

## 2021-10-03 NOTE — Assessment & Plan Note (Addendum)
Chronic, stable.  Continue candesartan 8 mg daily.  He will continue following with [facility]ology.  will follow ?

## 2021-10-03 NOTE — Progress Notes (Signed)
? ?Subjective:  ? ? Patient ID: Brett Bradshaw, male    DOB: Jun 14, 1951, 71 y.o.   MRN: [id number] ? ?CC: Brett Bradshaw is a 71 y.o. male who presents today for follow up.  ? ?HPI: Feels well today ?No new complaints ? ? ?Hypertension-compliant with candesartan 8 mg daily.  No chest pain or shortness of breath ? ?Hyperlipidemia-compliant with Crestor 20 mg ? ?Elevated PSA-previously followed with [facility] oncology.  Last seen 03/11/2021 with plan to return in 1 years time for PSA and DRE ? ?he continues to follow with [facility]ology.  Last visit 09/21/2021 for  hypertension, hypercholesterolemia ?He is no longer on chlorthalidone or potassium supplement.  He was started on candesartan 8 mg daily. CMP ordered for 2 weeks after initiation at [facility] 09/21/21 ? ?Due colonoscopy.  Upcoming appointment with Dr. Vicente Males 12/01/21 ? ?HISTORY:  ?Past Medical History:  ?Diagnosis Date  ? Chicken pox   ? Glaucoma   ? Bilateral eyes  ? Hypertension   ? Sarcoidosis 1996  ? ?Past Surgical History:  ?Procedure Laterality Date  ? pyloric stenosis    ? newborn  ? ?Family History  ?Problem Relation Age of Onset  ? Cancer Paternal Uncle   ?     Prostate  ? Diabetes Brother   ?     Type I  ? Dementia Mother   ? Stroke Mother   ? COPD Father   ? Hypertension Father   ? Heart disease Father   ? Kidney cancer Neg Hx   ? Bladder Cancer Neg Hx   ? ? ?Allergies: Patient has no known allergies. ?Current Outpatient Medications on File Prior to Visit  ?Medication Sig Dispense Refill  ? bimatoprost (LUMIGAN) 0.01 % SOLN 1 drop nightly.     ? candesartan (ATACAND) 8 MG tablet Take 8 mg by mouth daily.    ? rosuvastatin (CRESTOR) 20 MG tablet TAKE 1 TABLET DAILY 90 tablet 3  ? sildenafil (VIAGRA) 25 MG tablet TAKE (1) TABLET BY MOUTH ONCE DAILY AS NEEDED FOR ERECTILE DYSFUNCTION. 10 tablet 2  ? Cholecalciferol 50 MCG (2000 UT) CAPS Take 1 tablet by mouth daily. (Patient not taking: Reported on 10/03/2021)    ? ?No current facility-administered medications on  file prior to visit.  ? ? ?Social History  ? ?Tobacco Use  ? Smoking status: Never  ? Smokeless tobacco: Never  ?Vaping Use  ? Vaping Use: Never used  ?Substance Use Topics  ? Alcohol use: Yes  ?  Alcohol/week: 5.0 standard drinks  ?  Types: 5 Standard drinks or equivalent per week  ?  Comment: weekly  ? Drug use: No  ? ? ?Review of Systems  ?Constitutional:  Negative for chills and fever.  ?HENT:  Negative for congestion, ear pain, rhinorrhea, sinus pressure and sore throat.   ?Respiratory:  Negative for cough, shortness of breath and wheezing.   ?Cardiovascular:  Negative for chest pain and palpitations.  ?Gastrointestinal:  Negative for diarrhea, nausea and vomiting.  ?Genitourinary:  Negative for dysuria.  ?Musculoskeletal:  Negative for myalgias.  ?Skin:  Negative for rash.  ?Neurological:  Negative for headaches.  ?Hematological:  Negative for adenopathy.  ?   ?Objective:  ?  ?BP 122/72 (BP Location: Left Arm, Patient Position: Sitting, Cuff Size: Normal)   Pulse 69   Temp (!) 96.1 ?F (35.6 ?C) (Oral)   Ht '6\' 2"'$  (1.88 m)   Wt 187 lb 12.8 oz (85.2 kg)   SpO2 99%   BMI 24.11 kg/m?  ?  BP Readings from Last 3 Encounters:  ?10/03/21 122/72  ?04/04/21 108/62  ?09/29/20 122/82  ? ?Wt Readings from Last 3 Encounters:  ?10/03/21 187 lb 12.8 oz (85.2 kg)  ?04/04/21 186 lb 9.6 oz (84.6 kg)  ?09/29/20 188 lb (85.3 kg)  ? ? ?Physical Exam ?Vitals reviewed.  ?Constitutional:   ?   Appearance: He is well-developed.  ?Cardiovascular:  ?   Rate and Rhythm: Regular rhythm.  ?   Heart sounds: Normal heart sounds.  ?Pulmonary:  ?   Effort: Pulmonary effort is normal. No respiratory distress.  ?   Breath sounds: Normal breath sounds. No wheezing, rhonchi or rales.  ?Skin: ?   General: Skin is warm and dry.  ?Neurological:  ?   Mental Status: He is alert.  ?Psychiatric:     ?   Speech: Speech normal.     ?   Behavior: Behavior normal.  ? ? ?   ?Assessment & Plan:  ? ?Problem List Items Addressed This Visit   ? ?  ?  Cardiovascular and Mediastinum  ? Essential hypertension - Primary  ?  Chronic, stable.  Continue candesartan 8 mg daily.  He will continue following with [facility] cardiology.  will follow ?  ?  ? Relevant Orders  ? CBC with Differential/Platelet  ? Comprehensive metabolic panel  ? Lipid panel  ?  ? Other  ? Elevated PSA  ?  Chronic, stable.  He continues to follow annually with [facility] oncology.  Will follow ?  ?  ? HLD (hyperlipidemia)  ?  Chronic, stable.  Continue Crestor 20 mg ?  ?  ? ? ? ?I have discontinued Spike Yorks's chlorthalidone and potassium chloride SA. I am also having him maintain his bimatoprost, Cholecalciferol, rosuvastatin, sildenafil, and candesartan. ? ? ?No orders of the defined types were placed in this encounter. ? ? ?Return precautions given.  ? ?Risks, benefits, and alternatives of the medications and treatment plan prescribed today were discussed, and patient expressed understanding.  ? ?Education regarding symptom management and diagnosis given to patient on AVS. ? ?Continue to follow with Burnard Hawthorne, FNP for routine health maintenance.  ? ?[name] and I agreed with plan.  ? ?Mable Paris, FNP ? ? ?

## 2021-10-03 NOTE — Assessment & Plan Note (Signed)
Chronic, stable.  He continues to follow annually with [facility] oncology.  Will follow ?

## 2021-10-03 NOTE — Telephone Encounter (Signed)
Mychart msg sent

## 2021-10-04 ENCOUNTER — Telehealth: Payer: Self-pay

## 2021-10-04 NOTE — Telephone Encounter (Addendum)
Spoke to patient about his result notes. He verbalized understanding. Patient stated that he just wants to wait a little  longer to see if his numbers come down before he goes for referral to hematology ?

## 2021-10-14 ENCOUNTER — Encounter: Payer: Self-pay | Admitting: Family

## 2021-10-17 ENCOUNTER — Other Ambulatory Visit: Payer: Self-pay | Admitting: Family

## 2021-10-17 DIAGNOSIS — R17 Unspecified jaundice: Secondary | ICD-10-CM

## 2021-11-11 ENCOUNTER — Other Ambulatory Visit: Payer: Medicare Other

## 2021-11-11 DIAGNOSIS — E876 Hypokalemia: Secondary | ICD-10-CM | POA: Diagnosis not present

## 2021-11-11 DIAGNOSIS — R17 Unspecified jaundice: Secondary | ICD-10-CM

## 2021-11-11 NOTE — Addendum Note (Signed)
Addended by: Neta Ehlers on: 11/11/2021 02:16 PM ? ? Modules accepted: Orders ? ?

## 2021-11-12 LAB — BASIC METABOLIC PANEL WITH GFR
BUN: 16 mg/dL (ref 7–25)
CO2: 28 mmol/L (ref 20–32)
Calcium: 9.3 mg/dL (ref 8.6–10.3)
Chloride: 106 mmol/L (ref 98–110)
Creat: 1.11 mg/dL (ref 0.70–1.28)
Glucose, Bld: 89 mg/dL (ref 65–99)
Potassium: 4.2 mmol/L (ref 3.5–5.3)
Sodium: 140 mmol/L (ref 135–146)
eGFR: 71 mL/min/{1.73_m2} (ref >=60)

## 2021-11-12 LAB — GAMMA GT: GGT: 33 U/L (ref 3–70)

## 2021-11-12 LAB — BILIRUBIN, FRACTIONATED(TOT/DIR/INDIR)
Bilirubin, Direct: 0.2 mg/dL (ref 0.0–0.2)
Indirect Bilirubin: 0.8 mg/dL (calc) (ref 0.2–1.2)
Total Bilirubin: 1 mg/dL (ref 0.2–1.2)

## 2021-11-14 ENCOUNTER — Encounter: Payer: Self-pay | Admitting: Family

## 2021-12-01 ENCOUNTER — Ambulatory Visit (INDEPENDENT_AMBULATORY_CARE_PROVIDER_SITE_OTHER): Payer: Medicare Other | Admitting: Gastroenterology

## 2021-12-01 ENCOUNTER — Encounter: Payer: Self-pay | Admitting: Gastroenterology

## 2021-12-01 VITALS — BP 161/82 | HR 66 | Temp 97.7°F | Ht 74.0 in | Wt 190.2 lb

## 2021-12-01 DIAGNOSIS — Z1211 Encounter for screening for malignant neoplasm of colon: Secondary | ICD-10-CM | POA: Diagnosis not present

## 2021-12-01 DIAGNOSIS — Z1159 Encounter for screening for other viral diseases: Secondary | ICD-10-CM

## 2021-12-01 MED ORDER — NA SULFATE-K SULFATE-MG SULF 17.5-3.13-1.6 GM/177ML PO SOLN: 1.0000 | Freq: Once | ORAL | 0 refills | Status: AC

## 2021-12-01 NOTE — Patient Instructions (Signed)
Gilbert (zheel-BAYR) syndrome is a common, harmless liver condition in which the liver doesn't properly process bilirubin. Bilirubin is produced by the breakdown of red blood cells.  Rosanna Randy syndrome is an inherited genetic condition. You might not know you have Gilbert syndrome until it's discovered by accident, such as when a blood test shows raised bilirubin levels.  Gilbert syndrome requires no treatment.

## 2021-12-01 NOTE — Addendum Note (Signed)
Addended by: Eliseo Squires on: 12/01/2021 02:16 PM   Modules accepted: Orders

## 2021-12-01 NOTE — Addendum Note (Signed)
Addended by: Eliseo Squires on: 12/01/2021 02:19 PM   Modules accepted: Orders

## 2021-12-01 NOTE — Progress Notes (Signed)
Jonathon Bellows MD, MRCP(U.K) [address]  [place]  [place], [place] [address]  Main: [phone]  Fax: [phone]   Gastroenterology Consultation  Referring Provider:     Burnard Hawthorne, FNP Primary Care Physician:  Burnard Hawthorne, FNP Primary Gastroenterologist:  Dr. Jonathon Bellows  Reason for Consultation: Elevated bilirubin        HPI:   Brett Bradshaw is a 71 y.o. y/o male referred for consultation & management  by Burnard Hawthorne, FNP.     I have been asked to see this patient for elevated bilirubin.  In December 2022 right upper quadrant ultrasound which was negative.  I reviewed the labs from March 2023 total bilirubin of range from 1.3-2.0 ongoing for more than 6 months.  Transaminases were normal.,  GT normal.  Fractionated component of 0.3 and predominantly indirect hyperbilirubinemia.  Hep C antibody negative in 2018.  Not been tested for hepatitis B.  No over-the-counter supplements, no tattoos, no excess alcohol use, no excess Tylenol use, no incarceration, no Armed forces logistics/support/administrative officer.  No history of liver disease.  No GI symptoms.  He states he is due for a colonoscopy cannot recall exactly when he last colonoscopy which was out of town unable to get the report.  No prior history of colon polyps or cancer.  No family history of colon polyps or cancer.  Past Medical History:  Diagnosis Date   Chicken pox    Glaucoma    Bilateral eyes   Hypertension    Sarcoidosis 1996    Past Surgical History:  Procedure Laterality Date   pyloric stenosis     newborn    Prior to Admission medications   Medication Sig Start Date End Date Taking? Authorizing Provider  azelastine (ASTELIN) 0.1 % nasal spray Place 1 spray into both nostrils 2 (two) times daily. Use in each nostril as directed 10/03/21  Yes Arnett, Yvetta Coder, FNP  bimatoprost (LUMIGAN) 0.01 % SOLN 1 drop nightly.  09/09/13  Yes [provider]  candesartan (ATACAND) 8 MG tablet Take 8 mg by mouth  daily. 09/21/21  Yes [provider]  Cholecalciferol 50 MCG (2000 UT) CAPS Take 1 tablet by mouth daily.   Yes [provider]  rosuvastatin (CRESTOR) 20 MG tablet TAKE 1 TABLET DAILY 05/23/21  Yes Arnett, Yvetta Coder, FNP  sildenafil (VIAGRA) 25 MG tablet TAKE (1) TABLET BY MOUTH ONCE DAILY AS NEEDED FOR ERECTILE DYSFUNCTION. 08/16/21  Yes Arnett, Yvetta Coder, FNP    Family History  Problem Relation Age of Onset   Cancer Paternal Uncle        Prostate   Diabetes Brother        Type I   Dementia Mother    Stroke Mother    COPD Father    Hypertension Father    Heart disease Father    Kidney cancer Neg Hx    Bladder Cancer Neg Hx      Social History   Tobacco Use   Smoking status: Never   Smokeless tobacco: Never  Vaping Use   Vaping Use: Never used  Substance Use Topics   Alcohol use: Yes    Alcohol/week: 5.0 standard drinks    Types: 5 Standard drinks or equivalent per week    Comment: weekly   Drug use: No    Allergies as of 12/01/2021   (No Known Allergies)    Review of Systems:    All systems reviewed and negative except where noted in  HPI.   Physical Exam:  BP (!) 161/82   Pulse 66   Temp 97.7 F (36.5 C) (Oral)   Ht '6\' 2"'$  (1.88 m)   Wt 190 lb 3.2 oz (86.3 kg)   BMI 24.42 kg/m  No LMP for male patient. Psych:  Alert and cooperative. Normal mood and affect. General:   Alert,  Well-developed, well-nourished, pleasant and cooperative in NAD Head:  Normocephalic and atraumatic. Eyes:  Sclera clear, no icterus.   Conjunctiva pink. Ears:  Normal auditory acuity. Neurologic:  Alert and oriented x3;  grossly normal neurologically. Psych:  Alert and cooperative. Normal mood and affect.  Imaging Studies: No results found.  Assessment and Plan:   Brett Bradshaw is a 71 y.o. y/o male has been referred for elevated bilirubin.  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  Patient has been provided information about this condition as part of health maintenance we will screen him for hepatitis B and he is due for a screening colonoscopy which I shall proceed with.   I have discussed alternative options, risks & benefits,  which include, but are not limited to, bleeding, infection, perforation,respiratory complication & drug reaction.  The patient agrees with this plan & written consent will be obtained.     Follow up in as needed  Dr Jonathon Bellows MD,MRCP(U.K)

## 2021-12-02 LAB — HEPATITIS B SURFACE ANTIGEN: Hepatitis B Surface Ag: NEGATIVE

## 2021-12-02 LAB — HEPATITIS B SURFACE ANTIBODY,QUALITATIVE: Hep B Surface Ab, Qual: NONREACTIVE

## 2021-12-06 ENCOUNTER — Encounter: Payer: Self-pay | Admitting: Gastroenterology

## 2021-12-06 ENCOUNTER — Ambulatory Visit: Payer: Medicare Other | Admitting: Anesthesiology

## 2021-12-06 ENCOUNTER — Encounter
Admission: RE | Disposition: A | Discharge status: Home / Self Care | Payer: Self-pay | Source: Ambulatory Visit | Attending: Gastroenterology

## 2021-12-06 ENCOUNTER — Ambulatory Visit
Admission: RE | Admit: 2021-12-06 | Discharge: 2021-12-06 | Disposition: A | Discharge status: Home / Self Care | Payer: Medicare Other | Source: Ambulatory Visit | Attending: Gastroenterology | Admitting: Gastroenterology

## 2021-12-06 DIAGNOSIS — Z1211 Encounter for screening for malignant neoplasm of colon: Secondary | ICD-10-CM | POA: Insufficient documentation

## 2021-12-06 DIAGNOSIS — D120 Benign neoplasm of cecum: Secondary | ICD-10-CM | POA: Diagnosis not present

## 2021-12-06 DIAGNOSIS — I10 Essential (primary) hypertension: Secondary | ICD-10-CM | POA: Diagnosis not present

## 2021-12-06 DIAGNOSIS — D123 Benign neoplasm of transverse colon: Secondary | ICD-10-CM | POA: Diagnosis not present

## 2021-12-06 DIAGNOSIS — K635 Polyp of colon: Secondary | ICD-10-CM

## 2021-12-06 DIAGNOSIS — K219 Gastro-esophageal reflux disease without esophagitis: Secondary | ICD-10-CM | POA: Insufficient documentation

## 2021-12-06 HISTORY — PX: COLONOSCOPY WITH PROPOFOL: SHX5780

## 2021-12-06 SURGERY — COLONOSCOPY WITH PROPOFOL
Anesthesia: General

## 2021-12-06 MED ORDER — PHENYLEPHRINE HCL (PRESSORS) 10 MG/ML IV SOLN
INTRAVENOUS | Status: DC | PRN
Start: 2021-12-06 — End: 2021-12-06
  Administered 2021-12-06: 160 ug via INTRAVENOUS

## 2021-12-06 MED ORDER — PROPOFOL 10 MG/ML IV BOLUS
INTRAVENOUS | Status: DC | PRN
  Administered 2021-12-06: 80 mg via INTRAVENOUS

## 2021-12-06 MED ORDER — SODIUM CHLORIDE 0.9 % IV SOLN: INTRAVENOUS | Status: DC

## 2021-12-06 MED ORDER — PROPOFOL 500 MG/50ML IV EMUL
INTRAVENOUS | Status: DC | PRN
  Administered 2021-12-06: 200 ug/kg/min via INTRAVENOUS

## 2021-12-06 NOTE — Anesthesia Preprocedure Evaluation (Signed)
Anesthesia Evaluation  Patient identified by MRN, date of birth, ID band Patient awake    Reviewed: Allergy & Precautions, NPO status , Patient's Chart, lab work & pertinent test results  History of Anesthesia Complications Negative for: history of anesthetic complications  Airway Mallampati: III  TM Distance: <3 FB Neck ROM: full    Dental  (+) Chipped   Pulmonary neg pulmonary ROS, neg shortness of breath,    Pulmonary exam normal        Cardiovascular Exercise Tolerance: Good hypertension, (-) angina(-) Past MI Normal cardiovascular exam     Neuro/Psych negative neurological ROS  negative psych ROS   GI/Hepatic negative GI ROS, Neg liver ROS, neg GERD  ,  Endo/Other  negative endocrine ROS  Renal/GU negative Renal ROS  negative genitourinary   Musculoskeletal   Abdominal   Peds  Hematology negative hematology ROS (+)   Anesthesia Other Findings Past Medical History: No date: Chicken pox No date: Glaucoma     Comment:  Bilateral eyes No date: Hypertension 1996: Sarcoidosis  Past Surgical History: No date: pyloric stenosis     Comment:  newborn  BMI    Body Mass Index: 23.54 kg/m      Reproductive/Obstetrics negative OB ROS                             Anesthesia Physical Anesthesia Plan  ASA: 2  Anesthesia Plan: General   Post-op Pain Management:    Induction: Intravenous  PONV Risk Score and Plan: Propofol infusion and TIVA  Airway Management Planned: Natural Airway and Nasal Cannula  Additional Equipment:   Intra-op Plan:   Post-operative Plan:   Informed Consent: I have reviewed the patients History and Physical, chart, labs and discussed the procedure including the risks, benefits and alternatives for the proposed anesthesia with the patient or authorized representative who has indicated his/her understanding and acceptance.     Dental Advisory  Given  Plan Discussed with: Anesthesiologist, CRNA and Surgeon  Anesthesia Plan Comments: (Patient consented for risks of anesthesia including but not limited to:  - adverse reactions to medications - risk of airway placement if required - damage to eyes, teeth, lips or other oral mucosa - nerve damage due to positioning  - sore throat or hoarseness - Damage to heart, brain, nerves, lungs, other parts of body or loss of life  Patient voiced understanding.)        Anesthesia Quick Evaluation

## 2021-12-06 NOTE — Transfer of Care (Signed)
Immediate Anesthesia Transfer of Care Note  Patient: Brett Bradshaw  Procedure(s) Performed: COLONOSCOPY WITH PROPOFOL  Patient Location: PACU  Anesthesia Type:General  Level of Consciousness: sedated  Airway & Oxygen Therapy: Patient Spontanous Breathing and Patient connected to nasal cannula oxygen  Post-op Assessment: Report given to RN and Post -op Vital signs reviewed and stable  Post vital signs: Reviewed and stable  Last Vitals:  Vitals Value Taken Time  BP 92/55 12/06/21 0852  Temp    Pulse 63 12/06/21 0854  Resp 17 12/06/21 0854  SpO2 100 % 12/06/21 0854  Vitals shown include unvalidated device data.  Last Pain:  Vitals:   12/06/21 0747  TempSrc: Temporal  PainSc: 0-No pain         Complications: No notable events documented.

## 2021-12-06 NOTE — Anesthesia Postprocedure Evaluation (Signed)
Anesthesia Post Note  Patient: Fabion Gatson  Procedure(s) Performed: COLONOSCOPY WITH PROPOFOL  Patient location during evaluation: Endoscopy Anesthesia Type: General Level of consciousness: awake and alert Pain management: pain level controlled Vital Signs Assessment: post-procedure vital signs reviewed and stable Respiratory status: spontaneous breathing, nonlabored ventilation, respiratory function stable and patient connected to nasal cannula oxygen Cardiovascular status: blood pressure returned to baseline and stable Postop Assessment: no apparent nausea or vomiting Anesthetic complications: no   No notable events documented.   Last Vitals:  Vitals:   12/06/21 0900 12/06/21 0910  BP: 103/71 119/81  Pulse: 66 70  Resp: 15 13  Temp:    SpO2: 100% 100%    Last Pain:  Vitals:   12/06/21 0747  TempSrc: Temporal  PainSc: 0-No pain                 Precious Haws Tkeyah Burkman

## 2021-12-06 NOTE — H&P (Signed)
Brett Bellows, MD [address] [place], [place], [place], [address] [address], [place], [place], [place], [address] Phone: [phone]  Fax: [phone]  Primary Care Physician:  Burnard Hawthorne, FNP   Pre-Procedure History & Physical: HPI:  Brett Bradshaw is a 71 y.o. male is here for an colonoscopy.   Past Medical History:  Diagnosis Date   Chicken pox    Glaucoma    Bilateral eyes   Hypertension    Sarcoidosis 1996    Past Surgical History:  Procedure Laterality Date   pyloric stenosis     newborn    Prior to Admission medications   Medication Sig Start Date End Date Taking? Authorizing Provider  azelastine (ASTELIN) 0.1 % nasal spray Place 1 spray into both nostrils 2 (two) times daily. Use in each nostril as directed 10/03/21  Yes Arnett, Yvetta Coder, FNP  bimatoprost (LUMIGAN) 0.01 % SOLN 1 drop nightly.  09/09/13  Yes [provider]  candesartan (ATACAND) 8 MG tablet Take 8 mg by mouth daily. 09/21/21  Yes [provider]  Cholecalciferol 50 MCG (2000 UT) CAPS Take 1 tablet by mouth daily.   Yes [provider]  rosuvastatin (CRESTOR) 20 MG tablet TAKE 1 TABLET DAILY 05/23/21  Yes Arnett, Yvetta Coder, FNP  sildenafil (VIAGRA) 25 MG tablet TAKE (1) TABLET BY MOUTH ONCE DAILY AS NEEDED FOR ERECTILE DYSFUNCTION. 08/16/21   Burnard Hawthorne, FNP    Allergies as of 12/01/2021   (No Known Allergies)    Family History  Problem Relation Age of Onset   Cancer Paternal Uncle        Prostate   Diabetes Brother        Type I   Dementia Mother    Stroke Mother    COPD Father    Hypertension Father    Heart disease Father    Kidney cancer Neg Hx    Bladder Cancer Neg Hx     Social History   Socioeconomic History   Marital status: Married    Spouse name: Not on file   Number of children: Not on file   Years of education: Not on file   Highest education level: Not on file  Occupational History   Not on file  Tobacco Use    Smoking status: Never   Smokeless tobacco: Never  Vaping Use   Vaping Use: Never used  Substance and Sexual Activity   Alcohol use: Yes    Alcohol/week: 5.0 standard drinks    Types: 5 Standard drinks or equivalent per week    Comment: weekly   Drug use: No   Sexual activity: Yes    Partners: Female    Comment: Wife  Other Topics Concern   Not on file  Social History Narrative   Works as a Chief of Staff ,    Lives with wife, Therapist, sports.    Children- 2    Pets: none currently    Caffeine- No soda, 1 cup of coffee daily    Enjoys keeping up with financial information    Social Determinants of Health   Financial Resource Strain: Low Risk    Difficulty of Paying Living Expenses: Not hard at all  Food Insecurity: No Food Insecurity   Worried About Charity fundraiser in the Last Year: Never true   Ran Out of Food in the Last Year: Never true  Transportation Needs: No Transportation Needs   Lack of Transportation (Medical): No   Lack  of Transportation (Non-Medical): No  Physical Activity: Insufficiently Active   Days of Exercise per Week: 7 days   Minutes of Exercise per Session: 20 min  Stress: No Stress Concern Present   Feeling of Stress : Not at all  Social Connections: Socially Integrated   Frequency of Communication with Friends and Family: More than three times a week   Frequency of Social Gatherings with Friends and Family: Three times a week   Attends Religious Services: More than 4 times per year   Active Member of Clubs or Organizations: Yes   Attends Music therapist: More than 4 times per year   Marital Status: Married  Human resources officer Violence: Not At Risk   Fear of Current or Ex-Partner: No   Emotionally Abused: No   Physically Abused: No   Sexually Abused: No    Review of Systems: See HPI, otherwise negative ROS  Physical Exam: BP 129/80   Pulse 64   Temp (!) 96.9 F (36.1 C) (Temporal)   Resp 18   Ht '6\' 2"'$  (1.88 m)   Wt 83.2 kg    BMI 23.54 kg/m  General:   Alert,  pleasant and cooperative in NAD Head:  Normocephalic and atraumatic. Neck:  Supple; no masses or thyromegaly. Lungs:  Clear throughout to auscultation, normal respiratory effort.    Heart:  +S1, +S2, Regular rate and rhythm, No edema. Abdomen:  Soft, nontender and nondistended. Normal bowel sounds, without guarding, and without rebound.   Neurologic:  Alert and  oriented x4;  grossly normal neurologically.  Impression/Plan: Brett Bradshaw is here for an colonoscopy to be performed for Screening colonoscopy average risk   Risks, benefits, limitations, and alternatives regarding  colonoscopy have been reviewed with the patient.  Questions have been answered.  All parties agreeable.   Brett Bellows, MD  12/06/2021, 8:23 AM

## 2021-12-06 NOTE — Anesthesia Procedure Notes (Signed)
Date/Time: 12/06/2021 8:34 AM Performed by: Nelda Marseille, CRNA Pre-anesthesia Checklist: Patient identified, Emergency Drugs available, Suction available, Patient being monitored and Timeout performed Oxygen Delivery Method: Nasal cannula

## 2021-12-06 NOTE — Op Note (Signed)
[facility] Gastroenterology Patient Name: Brett Bradshaw Procedure Date: 12/06/2021 8:23 AM MRN: [id number] Account #: [id number] Date of Birth: 1951/04/08 Admit Type: Outpatient Age: 71 Room: [facility] ENDO ROOM 2 Gender: Male Note Status: Finalized Instrument Name: Park Meo [id number] Procedure:             Colonoscopy Indications:           Screening for colorectal malignant neoplasm Providers:             Jonathon Bellows MD, MD Referring MD:          Yvetta Coder. Arnett (Referring MD) Medicines:             Monitored Anesthesia Care Complications:         No immediate complications. Procedure:             Pre-Anesthesia Assessment:                        - Prior to the procedure, a History and Physical was                         performed, and patient medications, allergies and                         sensitivities were reviewed. The patient's tolerance                         of previous anesthesia was reviewed.                        - The risks and benefits of the procedure and the                         sedation options and risks were discussed with the                         patient. All questions were answered and informed                         consent was obtained.                        - ASA Grade Assessment: II - A patient with mild                         systemic disease.                        After obtaining informed consent, the colonoscope was                         passed under direct vision. Throughout the procedure,                         the patient's blood pressure, pulse, and oxygen                         saturations were monitored continuously. The                         Colonoscope was introduced  through the anus and                         advanced to the the cecum, identified by the                         appendiceal orifice. The colonoscopy was performed                         with ease. The patient tolerated the procedure well.                          The quality of the bowel preparation was good. Findings:      The perianal and digital rectal examinations were normal.      Two sessile polyps were found in the transverse colon and ascending       colon. The polyps were 4 to 6 mm in size. These polyps were removed with       a cold snare. Resection and retrieval were complete.      The exam was otherwise without abnormality on direct and retroflexion       views. Impression:            - Two 4 to 6 mm polyps in the transverse colon and in                         the ascending colon, removed with a cold snare.                         Resected and retrieved.                        - The examination was otherwise normal on direct and                         retroflexion views. Recommendation:        - Discharge patient to home (with escort).                        - Resume previous diet.                        - Continue present medications.                        - Await pathology results.                        - Repeat colonoscopy is not recommended due to current                         age (42 years or older) for surveillance. Procedure Code(s):     --- Professional ---                        [phone], Colonoscopy, flexible; with removal of                         tumor(s), polyp(s), or other lesion(s) by snare  technique Diagnosis Code(s):     --- Professional ---                        Z12.11, Encounter for screening for malignant neoplasm                         of colon                        K63.5, Polyp of colon CPT copyright 2019 American Medical Association. All rights reserved. The codes documented in this report are preliminary and upon coder review may  be revised to meet current compliance requirements. Jonathon Bellows, MD Jonathon Bellows MD, MD 12/06/2021 8:50:25 AM This report has been signed electronically. Number of Addenda: 0 Note Initiated On: 12/06/2021 8:23 AM Scope Withdrawal  Time: 0 hours 11 minutes 21 seconds  Total Procedure Duration: 0 hours 15 minutes 33 seconds  Estimated Blood Loss:  Estimated blood loss: none.      [facility]

## 2021-12-07 ENCOUNTER — Encounter: Payer: Self-pay | Admitting: Gastroenterology

## 2021-12-07 LAB — SURGICAL PATHOLOGY

## 2021-12-09 DIAGNOSIS — H2513 Age-related nuclear cataract, bilateral: Secondary | ICD-10-CM | POA: Diagnosis not present

## 2021-12-09 DIAGNOSIS — H40053 Ocular hypertension, bilateral: Secondary | ICD-10-CM | POA: Diagnosis not present

## 2021-12-09 NOTE — Progress Notes (Signed)
Not immune to hep B - suggest vaccine

## 2022-01-09 ENCOUNTER — Ambulatory Visit
Admission: RE | Admit: 2022-01-09 | Discharge: 2022-01-09 | Disposition: A | Discharge status: Home / Self Care | Payer: Medicare Other | Source: Ambulatory Visit | Attending: Emergency Medicine | Admitting: Emergency Medicine

## 2022-01-09 VITALS — BP 119/71 | HR 62 | Temp 98.0°F | Resp 18

## 2022-01-09 DIAGNOSIS — S81811A Laceration without foreign body, right lower leg, initial encounter: Secondary | ICD-10-CM

## 2022-01-09 DIAGNOSIS — Z23 Encounter for immunization: Secondary | ICD-10-CM

## 2022-01-09 MED ORDER — MUPIROCIN 2 % EX OINT: 1.0000 | TOPICAL_OINTMENT | Freq: Two times a day (BID) | CUTANEOUS | 0 refills | Status: DC

## 2022-01-09 MED ORDER — TETANUS-DIPHTH-ACELL PERTUSSIS 5-2.5-18.5 LF-MCG/0.5 IM SUSY
0.5000 mL | PREFILLED_SYRINGE | Freq: Once | INTRAMUSCULAR | Status: AC
  Administered 2022-01-09: 0.5 mL via INTRAMUSCULAR

## 2022-01-09 NOTE — ED Provider Notes (Signed)
Brett Bradshaw    CSN: [id number] Arrival date & time: 01/09/22  0865      History   Chief Complaint Chief Complaint  Patient presents with   Leg Injury    Knocked right lower leg on glass coffee table Friday evening.Gash approx. 2 ins long and 1/4 inch deep.Appears to be healing and slightly red around edges.Cannot remember date of last tetanus shot. - Entered by patient    HPI Brett Bradshaw is a 71 y.o. male.  Patient presents with a laceration on his right lower leg that occurred on 01/06/2022 he hit his leg on a glass table.  He has cleaned the area and left it open to air.  He denies fever, chills, wound drainage, or other symptoms.  He is unsure of when he had his last tetanus booster.  His medical history includes hypertension, glaucoma, sarcoidosis.  The history is provided by the patient and medical records.    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   Erectile dysfunction 01/02/2018   Elevated PSA 09/19/2017   Crushing injury of foot 12/26/2016   Fracture of phalanx of foot 12/26/2016   Routine general medical examination at a health care facility 09/14/2015   Thrombocytopenia (HCC) 01/27/2015   HLD (hyperlipidemia) 01/27/2015   Essential hypertension 01/27/2015    Past Surgical History:  Procedure Laterality Date   COLONOSCOPY WITH PROPOFOL N/A 12/06/2021   Procedure: COLONOSCOPY WITH PROPOFOL;  Surgeon: Wyline Mood, MD;  Location: [facility] ENDOSCOPY;  Service: Gastroenterology;  Laterality: N/A;   pyloric stenosis     newborn       Home Medications    Prior to Admission medications   Medication Sig Start Date End Date Taking? Authorizing Provider  mupirocin ointment (BACTROBAN) 2 % Apply 1 Application topically 2 (two) times daily. 01/09/22  Yes Mickie Bail, NP  azelastine (ASTELIN) 0.1 % nasal spray Place 1 spray into both nostrils 2 (two) times daily. Use in each nostril as directed 10/03/21   Allegra Grana, FNP  bimatoprost (LUMIGAN) 0.01 % SOLN 1 drop nightly.  09/09/13   [provider]  candesartan (ATACAND) 8 MG tablet Take 8 mg by mouth daily. 09/21/21   [provider]  Cholecalciferol 50 MCG (2000 UT) CAPS Take 1 tablet by mouth daily.    [provider]  rosuvastatin (CRESTOR) 20 MG tablet TAKE 1 TABLET DAILY 05/23/21   Allegra Grana, FNP  sildenafil (VIAGRA) 25 MG tablet TAKE (1) TABLET BY MOUTH ONCE DAILY AS NEEDED FOR ERECTILE DYSFUNCTION. 08/16/21   Allegra Grana, FNP    Family History Family History  Problem Relation Age of Onset   Cancer Paternal Uncle        Prostate   Diabetes Brother        Type I   Dementia Mother    Stroke Mother    COPD Father    Hypertension Father    Heart disease Father    Kidney cancer Neg Hx    Bladder Cancer Neg Hx     Social History Social History   Tobacco Use   Smoking status: Never   Smokeless tobacco: Never  Vaping Use   Vaping Use: Never used  Substance Use Topics  Alcohol use: Yes    Alcohol/week: 5.0 standard drinks of alcohol    Types: 5 Standard drinks or equivalent per week    Comment: weekly   Drug use: No     Allergies   Patient has no known allergies.   Review of Systems Review of Systems  Constitutional:  Negative for chills and fever.  Musculoskeletal:  Negative for arthralgias, gait problem and joint swelling.  Skin:  Positive for wound. Negative for color change.  Neurological:  Negative for weakness and numbness.  All other systems reviewed and are negative.    Physical Exam Triage Vital Signs ED Triage Vitals  Enc Vitals Group     BP      Pulse      Resp      Temp      Temp src      SpO2      Weight      Height      Head Circumference      Peak Flow      Pain Score      Pain Loc      Pain Edu?       Excl. in GC?    No data found.  Updated Vital Signs BP 119/71   Pulse 62   Temp 98 F (36.7 C)   Resp 18   SpO2 98%   Visual Acuity Right Eye Distance:   Left Eye Distance:   Bilateral Distance:    Right Eye Near:   Left Eye Near:    Bilateral Near:     Physical Exam Vitals and nursing note reviewed.  Constitutional:      General: He is not in acute distress.    Appearance: Normal appearance. He is well-developed. He is not ill-appearing.  HENT:     Mouth/Throat:     Mouth: Mucous membranes are moist.  Cardiovascular:     Rate and Rhythm: Normal rate and regular rhythm.  Pulmonary:     Effort: Pulmonary effort is normal. No respiratory distress.  Musculoskeletal:        General: No deformity. Normal range of motion.     Cervical back: Neck supple.  Skin:    General: Skin is warm and dry.     Capillary Refill: Capillary refill takes less than 2 seconds.     Findings: Lesion present. No erythema.     Comments: Healing laceration on RLE; no drainage or erythema.  See picture for details.   Neurological:     General: No focal deficit present.     Mental Status: He is alert and oriented to person, place, and time.     Sensory: No sensory deficit.     Motor: No weakness.     Gait: Gait normal.  Psychiatric:        Mood and Affect: Mood normal.        Behavior: Behavior normal.       UC Treatments / Results  Labs (all labs ordered are listed, but only abnormal results are displayed) Labs Reviewed - No data to display  EKG   Radiology No results found.  Procedures Procedures (including critical care time)  Medications Ordered in UC Medications  Tdap (BOOSTRIX) injection 0.5 mL (0.5 mLs Intramuscular Given 01/09/22 1026)    Initial Impression / Assessment and Plan / UC Course  I have reviewed the triage vital signs and the nursing notes.  Pertinent labs & imaging results that were available during my care of the  patient were reviewed by me and  considered in my medical decision making (see chart for details).   RLE laceration.  The wound occurred 3 days ago.  Patient is unsure of when he last had a tetanus booster.  Tetanus updated today.  Wound care instructions and signs of infection discussed with patient.  Treating with mupirocin ointment.  Instructed patient to follow-up with his PCP in 2 days for a recheck of the wound; follow-up sooner if he notes signs of infection.  Education provided on wound care.  Patient agrees to plan of care.   Final Clinical Impressions(s) / UC Diagnoses   Final diagnoses:  Laceration of right lower leg, initial encounter     Discharge Instructions      Your tetanus was updated today.    Keep your wound clean and dry.  Wash it gently twice a day with soap and water.  Apply the antibiotic cream and a bandage twice a day.    Follow up with your doctor in 2 days for a recheck of the wound.  Follow up right away if you see signs of infection, such as increased pain, redness, pus-like drainage, warmth, fever, chills, or other concerning symptoms.        ED Prescriptions     Medication Sig Dispense Auth. Provider   mupirocin ointment (BACTROBAN) 2 % Apply 1 Application topically 2 (two) times daily. 22 g Mickie Bail, NP      PDMP not reviewed this encounter.   Mickie Bail, NP 01/09/22 1027

## 2022-01-12 DIAGNOSIS — D696 Thrombocytopenia, unspecified: Secondary | ICD-10-CM | POA: Diagnosis not present

## 2022-01-12 DIAGNOSIS — I10 Essential (primary) hypertension: Secondary | ICD-10-CM | POA: Diagnosis not present

## 2022-01-12 DIAGNOSIS — E785 Hyperlipidemia, unspecified: Secondary | ICD-10-CM | POA: Diagnosis not present

## 2022-01-12 DIAGNOSIS — Z79899 Other long term (current) drug therapy: Secondary | ICD-10-CM | POA: Diagnosis not present

## 2022-01-12 DIAGNOSIS — E668 Other obesity: Secondary | ICD-10-CM | POA: Diagnosis not present

## 2022-01-12 DIAGNOSIS — H2511 Age-related nuclear cataract, right eye: Secondary | ICD-10-CM | POA: Diagnosis not present

## 2022-01-12 DIAGNOSIS — R972 Elevated prostate specific antigen [PSA]: Secondary | ICD-10-CM | POA: Diagnosis not present

## 2022-01-12 DIAGNOSIS — E7800 Pure hypercholesterolemia, unspecified: Secondary | ICD-10-CM | POA: Diagnosis not present

## 2022-01-26 DIAGNOSIS — E669 Obesity, unspecified: Secondary | ICD-10-CM | POA: Diagnosis not present

## 2022-01-26 DIAGNOSIS — H2512 Age-related nuclear cataract, left eye: Secondary | ICD-10-CM | POA: Diagnosis not present

## 2022-01-26 DIAGNOSIS — R972 Elevated prostate specific antigen [PSA]: Secondary | ICD-10-CM | POA: Diagnosis not present

## 2022-01-26 DIAGNOSIS — E785 Hyperlipidemia, unspecified: Secondary | ICD-10-CM | POA: Diagnosis not present

## 2022-01-26 DIAGNOSIS — D696 Thrombocytopenia, unspecified: Secondary | ICD-10-CM | POA: Diagnosis not present

## 2022-01-26 DIAGNOSIS — I10 Essential (primary) hypertension: Secondary | ICD-10-CM | POA: Diagnosis not present

## 2022-01-26 DIAGNOSIS — E7800 Pure hypercholesterolemia, unspecified: Secondary | ICD-10-CM | POA: Diagnosis not present

## 2022-01-26 DIAGNOSIS — Z79899 Other long term (current) drug therapy: Secondary | ICD-10-CM | POA: Diagnosis not present

## 2022-03-10 DIAGNOSIS — Z87898 Personal history of other specified conditions: Secondary | ICD-10-CM | POA: Diagnosis not present

## 2022-03-10 DIAGNOSIS — R972 Elevated prostate specific antigen [PSA]: Secondary | ICD-10-CM | POA: Diagnosis not present

## 2022-05-29 DIAGNOSIS — H40053 Ocular hypertension, bilateral: Secondary | ICD-10-CM | POA: Diagnosis not present

## 2022-05-30 ENCOUNTER — Other Ambulatory Visit: Payer: Self-pay | Admitting: Family

## 2022-05-30 DIAGNOSIS — N529 Male erectile dysfunction, unspecified: Secondary | ICD-10-CM

## 2022-07-04 ENCOUNTER — Other Ambulatory Visit: Payer: Self-pay | Admitting: Family

## 2022-07-24 DIAGNOSIS — L304 Erythema intertrigo: Secondary | ICD-10-CM | POA: Diagnosis not present

## 2022-07-24 DIAGNOSIS — D2270 Melanocytic nevi of unspecified lower limb, including hip: Secondary | ICD-10-CM | POA: Diagnosis not present

## 2022-07-24 DIAGNOSIS — Z85828 Personal history of other malignant neoplasm of skin: Secondary | ICD-10-CM | POA: Diagnosis not present

## 2022-07-24 DIAGNOSIS — L814 Other melanin hyperpigmentation: Secondary | ICD-10-CM | POA: Diagnosis not present

## 2022-07-24 DIAGNOSIS — L821 Other seborrheic keratosis: Secondary | ICD-10-CM | POA: Diagnosis not present

## 2022-07-24 DIAGNOSIS — D2260 Melanocytic nevi of unspecified upper limb, including shoulder: Secondary | ICD-10-CM | POA: Diagnosis not present

## 2022-07-24 DIAGNOSIS — L570 Actinic keratosis: Secondary | ICD-10-CM | POA: Diagnosis not present

## 2022-07-24 DIAGNOSIS — D225 Melanocytic nevi of trunk: Secondary | ICD-10-CM | POA: Diagnosis not present

## 2022-07-24 DIAGNOSIS — D1801 Hemangioma of skin and subcutaneous tissue: Secondary | ICD-10-CM | POA: Diagnosis not present

## 2022-07-27 DIAGNOSIS — H40053 Ocular hypertension, bilateral: Secondary | ICD-10-CM | POA: Diagnosis not present

## 2022-09-20 ENCOUNTER — Telehealth: Payer: Self-pay | Admitting: Family

## 2022-09-20 NOTE — Telephone Encounter (Signed)
Contacted Brett Bradshaw to schedule their annual wellness visit. Patient declined to schedule AWV at this time.  Patient prefers to see PCP.  Patient said he had upcoming cpx scheduled w/ provider.  I let patient know there was no appointment on the schedule.  Patient said   he'd ask his wife to call back and schedule cpx.  Thank you,  [place] Direct dial  [phone]

## 2022-10-02 ENCOUNTER — Other Ambulatory Visit: Payer: Self-pay | Admitting: Family

## 2022-10-02 ENCOUNTER — Telehealth: Payer: Self-pay

## 2022-10-02 NOTE — Telephone Encounter (Signed)
LVM to call back to schedule appt for in office visit. Pt has not been seen in 1 yr..refilled Crestor for 30 days w 1 refill.

## 2022-12-13 DIAGNOSIS — L820 Inflamed seborrheic keratosis: Secondary | ICD-10-CM | POA: Diagnosis not present

## 2022-12-13 DIAGNOSIS — L814 Other melanin hyperpigmentation: Secondary | ICD-10-CM | POA: Diagnosis not present

## 2022-12-13 DIAGNOSIS — Z85828 Personal history of other malignant neoplasm of skin: Secondary | ICD-10-CM | POA: Diagnosis not present

## 2022-12-13 DIAGNOSIS — L570 Actinic keratosis: Secondary | ICD-10-CM | POA: Diagnosis not present

## 2022-12-13 DIAGNOSIS — D1801 Hemangioma of skin and subcutaneous tissue: Secondary | ICD-10-CM | POA: Diagnosis not present

## 2022-12-13 DIAGNOSIS — L821 Other seborrheic keratosis: Secondary | ICD-10-CM | POA: Diagnosis not present

## 2022-12-27 ENCOUNTER — Encounter: Payer: Self-pay | Admitting: Family

## 2022-12-27 ENCOUNTER — Other Ambulatory Visit: Payer: Self-pay

## 2022-12-27 MED ORDER — ROSUVASTATIN CALCIUM 20 MG PO TABS: 20.0000 mg | ORAL_TABLET | Freq: Every day | ORAL | 1 refills | Status: DC

## 2023-02-08 DIAGNOSIS — H40053 Ocular hypertension, bilateral: Secondary | ICD-10-CM | POA: Diagnosis not present

## 2023-02-08 DIAGNOSIS — Z961 Presence of intraocular lens: Secondary | ICD-10-CM | POA: Diagnosis not present

## 2023-03-16 DIAGNOSIS — Z87898 Personal history of other specified conditions: Secondary | ICD-10-CM | POA: Diagnosis not present

## 2023-03-16 DIAGNOSIS — R972 Elevated prostate specific antigen [PSA]: Secondary | ICD-10-CM | POA: Diagnosis not present

## 2023-04-11 DIAGNOSIS — R972 Elevated prostate specific antigen [PSA]: Secondary | ICD-10-CM | POA: Diagnosis not present

## 2023-07-02 ENCOUNTER — Other Ambulatory Visit: Payer: Self-pay | Admitting: Family

## 2023-07-16 ENCOUNTER — Encounter: Payer: Self-pay | Admitting: Family

## 2023-07-16 ENCOUNTER — Ambulatory Visit (INDEPENDENT_AMBULATORY_CARE_PROVIDER_SITE_OTHER): Payer: Medicare Other | Admitting: Family

## 2023-07-16 VITALS — BP 121/70 | HR 84 | Temp 97.4°F | Ht 74.0 in | Wt 189.9 lb

## 2023-07-16 DIAGNOSIS — D696 Thrombocytopenia, unspecified: Secondary | ICD-10-CM

## 2023-07-16 DIAGNOSIS — I10 Essential (primary) hypertension: Secondary | ICD-10-CM | POA: Diagnosis not present

## 2023-07-16 DIAGNOSIS — E785 Hyperlipidemia, unspecified: Secondary | ICD-10-CM | POA: Diagnosis not present

## 2023-07-16 DIAGNOSIS — Z0000 Encounter for general adult medical examination without abnormal findings: Secondary | ICD-10-CM

## 2023-07-16 LAB — CBC WITH DIFFERENTIAL/PLATELET
Basophils Absolute: 0 10*3/uL (ref 0.0–0.1)
Basophils Relative: 0.2 % (ref 0.0–3.0)
Eosinophils Absolute: 0.1 10*3/uL (ref 0.0–0.7)
Eosinophils Relative: 1.2 % (ref 0.0–5.0)
HCT: 47.5 % (ref 39.0–52.0)
Hemoglobin: 16.3 g/dL (ref 13.0–17.0)
Lymphocytes Relative: 29.9 % (ref 12.0–46.0)
Lymphs Abs: 2.2 10*3/uL (ref 0.7–4.0)
MCHC: 34.3 g/dL (ref 30.0–36.0)
MCV: 93.6 fL (ref 78.0–100.0)
Monocytes Absolute: 0.7 10*3/uL (ref 0.1–1.0)
Monocytes Relative: 9.6 % (ref 3.0–12.0)
Neutro Abs: 4.4 10*3/uL (ref 1.4–7.7)
Neutrophils Relative %: 59.1 % (ref 43.0–77.0)
Platelets: 133 10*3/uL — ABNORMAL LOW (ref 150.0–400.0)
RBC: 5.08 Mil/uL (ref 4.22–5.81)
RDW: 14.2 % (ref 11.5–15.5)
WBC: 7.4 10*3/uL (ref 4.0–10.5)

## 2023-07-16 LAB — LIPID PANEL
Cholesterol: 153 mg/dL (ref 0–200)
HDL: 47.3 mg/dL (ref >39.00)
LDL Cholesterol: 74 mg/dL (ref 0–99)
NonHDL: 105.28
Total CHOL/HDL Ratio: 3
Triglycerides: 158 mg/dL — ABNORMAL HIGH (ref 0.0–149.0)
VLDL: 31.6 mg/dL (ref 0.0–40.0)

## 2023-07-16 LAB — COMPREHENSIVE METABOLIC PANEL
ALT: 12 U/L (ref 0–53)
AST: 15 U/L (ref 0–37)
Albumin: 4.6 g/dL (ref 3.5–5.2)
Alkaline Phosphatase: 43 U/L (ref 39–117)
BUN: 19 mg/dL (ref 6–23)
CO2: 29 meq/L (ref 19–32)
Calcium: 9.5 mg/dL (ref 8.4–10.5)
Chloride: 102 meq/L (ref 96–112)
Creatinine, Ser: 1.13 mg/dL (ref 0.40–1.50)
GFR: 63.97 mL/min (ref >60.00)
Glucose, Bld: 91 mg/dL (ref 70–99)
Potassium: 4.4 meq/L (ref 3.5–5.1)
Sodium: 140 meq/L (ref 135–145)
Total Bilirubin: 1.5 mg/dL — ABNORMAL HIGH (ref 0.2–1.2)
Total Protein: 6.7 g/dL (ref 6.0–8.3)

## 2023-07-16 LAB — TSH: TSH: 1.25 u[IU]/mL (ref 0.35–5.50)

## 2023-07-16 MED ORDER — ROSUVASTATIN CALCIUM 20 MG PO TABS: 20.0000 mg | ORAL_TABLET | Freq: Every day | ORAL | 3 refills | Status: DC

## 2023-07-16 NOTE — Patient Instructions (Signed)
Health Maintenance, Male Adopting a healthy lifestyle and getting preventive care are important in promoting health and wellness. Ask your health care provider about: The right schedule for you to have regular tests and exams. Things you can do on your own to prevent diseases and keep yourself healthy. What should I know about diet, weight, and exercise? Eat a healthy diet  Eat a diet that includes plenty of vegetables, fruits, low-fat dairy products, and lean protein. Do not eat a lot of foods that are high in solid fats, added sugars, or sodium. Maintain a healthy weight Body mass index (BMI) is a measurement that can be used to identify possible weight problems. It estimates body fat based on height and weight. Your health care provider can help determine your BMI and help you achieve or maintain a healthy weight. Get regular exercise Get regular exercise. This is one of the most important things you can do for your health. Most adults should: Exercise for at least 150 minutes each week. The exercise should increase your heart rate and make you sweat (moderate-intensity exercise). Do strengthening exercises at least twice a week. This is in addition to the moderate-intensity exercise. Spend less time sitting. Even light physical activity can be beneficial. Watch cholesterol and blood lipids Have your blood tested for lipids and cholesterol at 74 years of age, then have this test every 5 years. You may need to have your cholesterol levels checked more often if: Your lipid or cholesterol levels are high. You are older than 74 years of age. You are at high risk for heart disease. What should I know about cancer screening? Many types of cancers can be detected early and may often be prevented. Depending on your health history and family history, you may need to have cancer screening at various ages. This may include screening for: Colorectal cancer. Prostate cancer. Skin cancer. Lung  cancer. What should I know about heart disease, diabetes, and high blood pressure? Blood pressure and heart disease High blood pressure causes heart disease and increases the risk of stroke. This is more likely to develop in people who have high blood pressure readings or are overweight. Talk with your health care provider about your target blood pressure readings. Have your blood pressure checked: Every 3-5 years if you are 18-39 years of age. Every year if you are 40 years old or older. If you are between the ages of 65 and 75 and are a current or former smoker, ask your health care provider if you should have a one-time screening for abdominal aortic aneurysm (AAA). Diabetes Have regular diabetes screenings. This checks your fasting blood sugar level. Have the screening done: Once every three years after age 45 if you are at a normal weight and have a low risk for diabetes. More often and at a younger age if you are overweight or have a high risk for diabetes. What should I know about preventing infection? Hepatitis B If you have a higher risk for hepatitis B, you should be screened for this virus. Talk with your health care provider to find out if you are at risk for hepatitis B infection. Hepatitis C Blood testing is recommended for: Everyone born from 1945 through 1965. Anyone with known risk factors for hepatitis C. Sexually transmitted infections (STIs) You should be screened each year for STIs, including gonorrhea and chlamydia, if: You are sexually active and are younger than 74 years of age. You are older than 74 years of age and your   health care provider tells you that you are at risk for this type of infection. Your sexual activity has changed since you were last screened, and you are at increased risk for chlamydia or gonorrhea. Ask your health care provider if you are at risk. Ask your health care provider about whether you are at high risk for HIV. Your health care provider  may recommend a prescription medicine to help prevent HIV infection. If you choose to take medicine to prevent HIV, you should first get tested for HIV. You should then be tested every 3 months for as long as you are taking the medicine. Follow these instructions at home: Alcohol use Do not drink alcohol if your health care provider tells you not to drink. If you drink alcohol: Limit how much you have to 0-2 drinks a day. Know how much alcohol is in your drink. In the [place] one drink equals one 12 oz bottle of beer (355 mL), one 5 oz glass of wine (148 mL), or one 1 oz glass of hard liquor (44 mL). Lifestyle Do not use any products that contain nicotine or tobacco. These products include cigarettes, chewing tobacco, and vaping devices, such as e-cigarettes. If you need help quitting, ask your health care provider. Do not use street drugs. Do not share needles. Ask your health care provider for help if you need support or information about quitting drugs. General instructions Schedule regular health, dental, and eye exams. Stay current with your vaccines. Tell your health care provider if: You often feel depressed. You have ever been abused or do not feel safe at home. Summary Adopting a healthy lifestyle and getting preventive care are important in promoting health and wellness. Follow your health care provider's instructions about healthy diet, exercising, and getting tested or screened for diseases. Follow your health care provider's instructions on monitoring your cholesterol and blood pressure. This information is not intended to replace advice given to you by your health care provider. Make sure you discuss any questions you have with your health care provider. Document Revised: 11/22/2020 Document Reviewed: 11/22/2020 Elsevier Patient Education  2024 Elsevier Inc.  

## 2023-07-16 NOTE — Assessment & Plan Note (Signed)
Encouraged continued exercise.  He will continue close follow-up with [facility] in regards to history of elevated PSA.

## 2023-07-16 NOTE — Assessment & Plan Note (Signed)
Chronic, stable.  Continue candesartan 8 mg daily.  He will continue following with [facility].

## 2023-07-16 NOTE — Progress Notes (Signed)
Assessment & Plan:  Routine general medical examination at a health care facility Assessment & Plan: Encouraged continued exercise.  He will continue close follow-up with [facility] in regards to history of elevated PSA.    Hyperlipidemia, unspecified hyperlipidemia type -     Lipid panel -     Rosuvastatin Calcium; Take 1 tablet (20 mg total) by mouth daily.  Dispense: 90 tablet; Refill: 3  Thrombocytopenia (HCC) -     CBC with Differential/Platelet  Essential hypertension Assessment & Plan: Chronic, stable.  Continue candesartan 8 mg daily.  He will continue following with [facility].    Orders: -     Comprehensive metabolic panel -     TSH     Return precautions given.   Risks, benefits, and alternatives of the medications and treatment plan prescribed today were discussed, and patient expressed understanding.   Education regarding symptom management and diagnosis given to patient on AVS either electronically or printed.  Return for [name] upcoming or due, schedule.  Rennie Plowman, FNP  Subjective:    Patient ID: Brett Bradshaw, male    DOB: 21-Feb-1951, 72 y.o.   MRN: [id number]  CC: Brett Bradshaw is a 72 y.o. male who presents today for physical exam.    HPI: Feels well today.  No new complaints.   Compliant with candesartan 8mg  every day.  Denies chest pain, shortness of breath.  Continues to follow annually with [facility] cardiology, Dr Zonia Kief.  Follows annually with dermatology. Dr Mickeal Skinner  Colorectal  Cancer Screening: UTD , 12/06/21, no repeat colonoscopy is recommended Prostate Cancer Screening: He is following with [facility] last seen 03/16/2023 for elevated PSA   Lung Cancer Screening: No 30 year pack year history and > 50 years to 80 years.   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  Alcohol use:  weekly  Smoking/tobacco use: Nonsmoker.     Health Maintenance  Topic Date Due   COVID-19 Vaccine (1) Never done   Medicare Annual Wellness Visit  09/27/2022   Zoster (Shingles) Vaccine (1 of 2) 10/14/2023*   Flu Shot  10/15/2023*   Colon Cancer Screening  12/07/2026   DTaP/Tdap/Td vaccine (3 - Td or Tdap) 01/10/2032   Pneumonia Vaccine  Completed   Hepatitis C Screening  Completed   HPV Vaccine  Aged Out  *Topic was postponed. The date shown is not the original due date.     ALLERGIES: Patient has no known allergies.  Current Outpatient Medications on File Prior to Visit  Medication Sig Dispense Refill   azelastine (ASTELIN) 0.1 % nasal spray Place 1 spray into both nostrils 2 (two) times daily. Use in each nostril as directed 1 mL 4   bimatoprost (LUMIGAN) 0.01 % SOLN 1 drop nightly.      candesartan (ATACAND) 8 MG tablet Take 8 mg by mouth daily.     Cholecalciferol 50 MCG (2000 UT) CAPS Take 1 tablet by mouth daily.     mupirocin ointment (BACTROBAN) 2 % Apply 1 Application topically 2 (two) times daily. 22 g 0   sildenafil (VIAGRA) 25 MG tablet TAKE (1) TABLET BY MOUTH ONCE DAILY AS NEEDED FOR ERECTILE DYSFUNCTION. 10 tablet 2   No current facility-administered medications on file prior to visit.    Review of Systems    Objective:    BP 121/70   Pulse  84   Temp (!) 97.4 F (36.3 C) (Oral)   Ht 6\' 2"  (1.88 m)   Wt 189 lb 14.4 oz (86.1 kg)   SpO2 96%   BMI 24.38 kg/m   BP Readings from Last 3 Encounters:  07/16/23 121/70  01/09/22 119/71  12/06/21 119/81   Wt Readings from Last 3 Encounters:  07/16/23 189 lb 14.4 oz (86.1 kg)  12/06/21 183 lb 6.1 oz (83.2 kg)  12/01/21 190 lb 3.2 oz (86.3 kg)    Physical Exam

## 2023-08-13 DIAGNOSIS — Z961 Presence of intraocular lens: Secondary | ICD-10-CM | POA: Diagnosis not present

## 2023-08-13 DIAGNOSIS — H40053 Ocular hypertension, bilateral: Secondary | ICD-10-CM | POA: Diagnosis not present

## 2023-08-27 DIAGNOSIS — E7800 Pure hypercholesterolemia, unspecified: Secondary | ICD-10-CM | POA: Diagnosis not present

## 2023-08-27 DIAGNOSIS — Z23 Encounter for immunization: Secondary | ICD-10-CM | POA: Diagnosis not present

## 2023-08-27 DIAGNOSIS — I10 Essential (primary) hypertension: Secondary | ICD-10-CM | POA: Diagnosis not present

## 2023-12-13 DIAGNOSIS — Z85828 Personal history of other malignant neoplasm of skin: Secondary | ICD-10-CM | POA: Diagnosis not present

## 2023-12-13 DIAGNOSIS — D1801 Hemangioma of skin and subcutaneous tissue: Secondary | ICD-10-CM | POA: Diagnosis not present

## 2023-12-13 DIAGNOSIS — L814 Other melanin hyperpigmentation: Secondary | ICD-10-CM | POA: Diagnosis not present

## 2023-12-13 DIAGNOSIS — D229 Melanocytic nevi, unspecified: Secondary | ICD-10-CM | POA: Diagnosis not present

## 2023-12-13 DIAGNOSIS — L738 Other specified follicular disorders: Secondary | ICD-10-CM | POA: Diagnosis not present

## 2023-12-13 DIAGNOSIS — L821 Other seborrheic keratosis: Secondary | ICD-10-CM | POA: Diagnosis not present

## 2023-12-13 DIAGNOSIS — L570 Actinic keratosis: Secondary | ICD-10-CM | POA: Diagnosis not present

## 2024-02-11 DIAGNOSIS — H40053 Ocular hypertension, bilateral: Secondary | ICD-10-CM | POA: Diagnosis not present

## 2024-03-03 DIAGNOSIS — H26491 Other secondary cataract, right eye: Secondary | ICD-10-CM | POA: Diagnosis not present

## 2024-04-28 DIAGNOSIS — Z961 Presence of intraocular lens: Secondary | ICD-10-CM | POA: Diagnosis not present

## 2024-04-28 DIAGNOSIS — H40053 Ocular hypertension, bilateral: Secondary | ICD-10-CM | POA: Diagnosis not present

## 2024-06-06 ENCOUNTER — Ambulatory Visit: Admitting: Nurse Practitioner

## 2024-06-06 VITALS — BP 130/72 | HR 67 | Temp 98.3°F | Ht 74.0 in | Wt 195.8 lb

## 2024-06-06 DIAGNOSIS — L03116 Cellulitis of left lower limb: Secondary | ICD-10-CM | POA: Diagnosis not present

## 2024-06-06 MED ORDER — CEPHALEXIN 500 MG PO CAPS: 500.0000 mg | ORAL_CAPSULE | Freq: Three times a day (TID) | ORAL | 0 refills | Status: DC

## 2024-06-06 NOTE — Progress Notes (Signed)
 Leron Glance, NP-C Phone: [phone]  Brett Bradshaw is a 73 y.o. male who presents today for cut on leg.   Discussed the use of AI scribe software for clinical note transcription with the patient, who gave verbal consent to proceed.  History of Present Illness   Brett Bradshaw is a 73 year old male who presents with left leg swelling and tenderness following a shin injury.  Approximately two weeks ago, he sustained an injury to his left shin after hitting it on a glass cocktail table. Since then, he has experienced swelling in the left leg, which he first noticed during a bath when the left leg appeared larger than the right. Despite daily monitoring with a tape measure, the swelling has not significantly improved.  The soreness is localized to the shin area, particularly noticeable in the morning when the swelling decreases slightly. The swelling extends into his foot, but he does not experience significant pain, only occasional tenderness when pressure is applied. He has a history of similar minor injuries to the same area, which have healed without complication.  He is active, although he spends considerable time sitting at a computer. He elevates his legs regularly. His wife assists in monitoring the condition, checking for changes in size and tenderness.  No pain in the back of the leg. He has not applied any topical treatments to the area.      Social History   Tobacco Use  Smoking Status Never  Smokeless Tobacco Never    Current Outpatient Medications on File Prior to Visit  Medication Sig Dispense Refill   azelastine  (ASTELIN ) 0.1 % nasal spray Place 1 spray into both nostrils 2 (two) times daily. Use in each nostril as directed 1 mL 4   bimatoprost (LUMIGAN) 0.01 % SOLN 1 drop nightly.      candesartan (ATACAND) 8 MG tablet Take 8 mg by mouth daily.     Cholecalciferol 50 MCG (2000 UT) CAPS Take 1 tablet by mouth daily.     mupirocin  ointment (BACTROBAN ) 2 % Apply 1  Application topically 2 (two) times daily. 22 g 0   rosuvastatin  (CRESTOR ) 20 MG tablet Take 1 tablet (20 mg total) by mouth daily. 90 tablet 3   sildenafil  (VIAGRA ) 25 MG tablet TAKE (1) TABLET BY MOUTH ONCE DAILY AS NEEDED FOR ERECTILE DYSFUNCTION. 10 tablet 2   No current facility-administered medications on file prior to visit.     ROS see history of present illness  Objective  Physical Exam Vitals:   06/06/24 1408  BP: 130/72  Pulse: 67  Temp: 98.3 F (36.8 C)  SpO2: 99%    BP Readings from Last 3 Encounters:  06/06/24 130/72  07/16/23 121/70  01/09/22 119/71   Wt Readings from Last 3 Encounters:  06/06/24 195 lb 12.8 oz (88.8 kg)  07/16/23 189 lb 14.4 oz (86.1 kg)  12/06/21 183 lb 6.1 oz (83.2 kg)    Physical Exam Constitutional:      General: He is not in acute distress.    Appearance: Normal appearance.  HENT:     Head: Normocephalic.  Cardiovascular:     Rate and Rhythm: Normal rate and regular rhythm.     Heart sounds: Normal heart sounds.  Pulmonary:     Effort: Pulmonary effort is normal.     Breath sounds: Normal breath sounds.  Musculoskeletal:     Left lower leg: Swelling and tenderness (along healing wound) present.     Comments: No significant erythema or warmth.  Well healing wound/laceration present. See pic below  Skin:    General: Skin is warm and dry.  Neurological:     General: No focal deficit present.     Mental Status: He is alert.  Psychiatric:        Mood and Affect: Mood normal.        Behavior: Behavior normal.      Assessment/Plan: Please see individual problem list.  Cellulitis of left lower extremity Assessment & Plan: Symptoms consistent with cellulitis. No significant redness or heat, swelling and tenderness present, concern for progression. Start Keflex  500 mg three time daily. Monitor for increased redness, heat, pain, or systemic symptoms. Report worsening symptoms. Consider ultrasound if symptoms worsen or do not  improve with antibiotics.   Orders: -     Cephalexin ; Take 1 capsule (500 mg total) by mouth 3 (three) times daily.  Dispense: 21 capsule; Refill: 0     Return if symptoms worsen or fail to improve.   Leron Glance, NP-C [facility]

## 2024-06-20 ENCOUNTER — Encounter: Payer: Self-pay | Admitting: Nurse Practitioner

## 2024-06-20 NOTE — Assessment & Plan Note (Signed)
 Symptoms consistent with cellulitis. No significant redness or heat, swelling and tenderness present, concern for progression. Start Keflex  500 mg three time daily. Monitor for increased redness, heat, pain, or systemic symptoms. Report worsening symptoms. Consider ultrasound if symptoms worsen or do not improve with antibiotics.

## 2024-06-23 ENCOUNTER — Telehealth: Payer: Self-pay

## 2024-06-23 ENCOUNTER — Ambulatory Visit: Admitting: Family

## 2024-06-23 NOTE — Telephone Encounter (Signed)
 Error

## 2024-06-30 ENCOUNTER — Other Ambulatory Visit: Payer: Self-pay

## 2024-06-30 ENCOUNTER — Ambulatory Visit: Admitting: Family

## 2024-06-30 ENCOUNTER — Telehealth: Payer: Self-pay

## 2024-06-30 ENCOUNTER — Ambulatory Visit
Admission: RE | Admit: 2024-06-30 | Discharge: 2024-06-30 | Disposition: A | Discharge status: Home / Self Care | Source: Ambulatory Visit | Attending: Family | Admitting: Family

## 2024-06-30 ENCOUNTER — Emergency Department
Admission: EM | Admit: 2024-06-30 | Discharge: 2024-06-30 | Disposition: A | Discharge status: Home / Self Care | Source: Home / Self Care

## 2024-06-30 ENCOUNTER — Encounter: Payer: Self-pay | Admitting: Family

## 2024-06-30 VITALS — BP 130/70 | HR 98 | Temp 97.7°F | Ht 74.0 in | Wt 197.4 lb

## 2024-06-30 DIAGNOSIS — L03116 Cellulitis of left lower limb: Secondary | ICD-10-CM | POA: Diagnosis not present

## 2024-06-30 DIAGNOSIS — I82432 Acute embolism and thrombosis of left popliteal vein: Secondary | ICD-10-CM | POA: Diagnosis not present

## 2024-06-30 DIAGNOSIS — O2230 Deep phlebothrombosis in pregnancy, unspecified trimester: Secondary | ICD-10-CM

## 2024-06-30 DIAGNOSIS — I82402 Acute embolism and thrombosis of unspecified deep veins of left lower extremity: Secondary | ICD-10-CM | POA: Diagnosis not present

## 2024-06-30 DIAGNOSIS — M7989 Other specified soft tissue disorders: Secondary | ICD-10-CM | POA: Diagnosis present

## 2024-06-30 LAB — CBC WITH DIFFERENTIAL/PLATELET
Abs Immature Granulocytes: 0.02 K/uL (ref 0.00–0.07)
Basophils Absolute: 0 K/uL (ref 0.0–0.1)
Basophils Relative: 0 %
Eosinophils Absolute: 0.1 K/uL (ref 0.0–0.5)
Eosinophils Relative: 2 %
HCT: 45.1 % (ref 39.0–52.0)
Hemoglobin: 15.9 g/dL (ref 13.0–17.0)
Immature Granulocytes: 0 %
Lymphocytes Relative: 38 %
Lymphs Abs: 2.2 K/uL (ref 0.7–4.0)
MCH: 32 pg (ref 26.0–34.0)
MCHC: 35.3 g/dL (ref 30.0–36.0)
MCV: 90.7 fL (ref 80.0–100.0)
Monocytes Absolute: 0.5 K/uL (ref 0.1–1.0)
Monocytes Relative: 8 %
Neutro Abs: 3 K/uL (ref 1.7–7.7)
Neutrophils Relative %: 52 %
Platelets: 79 K/uL — ABNORMAL LOW (ref 150–400)
RBC: 4.97 MIL/uL (ref 4.22–5.81)
RDW: 13.3 % (ref 11.5–15.5)
Smear Review: NORMAL
WBC: 5.8 K/uL (ref 4.0–10.5)
nRBC: 0 % (ref 0.0–0.2)

## 2024-06-30 LAB — BASIC METABOLIC PANEL WITH GFR
Anion gap: 9 (ref 5–15)
BUN: 14 mg/dL (ref 8–23)
CO2: 26 mmol/L (ref 22–32)
Calcium: 9.6 mg/dL (ref 8.9–10.3)
Chloride: 105 mmol/L (ref 98–111)
Creatinine, Ser: 0.99 mg/dL (ref 0.61–1.24)
GFR, Estimated: >60 mL/min (ref >60)
Glucose, Bld: 118 mg/dL — ABNORMAL HIGH (ref 70–99)
Potassium: 4.3 mmol/L (ref 3.5–5.1)
Sodium: 140 mmol/L (ref 135–145)

## 2024-06-30 LAB — PROTIME-INR
INR: 1 (ref 0.8–1.2)
Prothrombin Time: 13.4 s (ref 11.4–15.2)

## 2024-06-30 LAB — APTT: aPTT: 29 s (ref 24–36)

## 2024-06-30 MED ORDER — APIXABAN 5 MG PO TABS
10.0000 mg | ORAL_TABLET | Freq: Once | ORAL | Status: AC
  Administered 2024-06-30: 17:00:00 10 mg via ORAL
  Filled 2024-06-30: qty 2

## 2024-06-30 MED ORDER — DOXYCYCLINE HYCLATE 100 MG PO TABS: 100.0000 mg | ORAL_TABLET | Freq: Two times a day (BID) | ORAL | 0 refills | Status: AC

## 2024-06-30 MED ORDER — APIXABAN (ELIQUIS) VTE STARTER PACK (10MG AND 5MG): ORAL_TABLET | ORAL | 0 refills | Status: DC

## 2024-06-30 NOTE — ED Provider Notes (Signed)
 [facility] Provider Note    Event Date/Time   First MD Initiated Contact with Patient 06/30/24 1558     (approximate)   History   Abnormal US    HPI  Brett Bradshaw is a 73 y.o. male who presents today with concern of DVT noted on ultrasound.  I reviewed the ultrasound imaging, seems that he had an occlusive thrombus in the left popliteal vein into the distal femoral vein as well as thrombi noted in the peroneal and posterior tibial veins.  He states that he first noticed the swelling around Thanksgiving.  When he was able to get in with a provider, he was initially given a course of Keflex  but since symptoms did not improve he had an ultrasound ordered for today.  He was instructed to come in after abnormal ultrasound findings from radiology department.  He denies any chest pain shortness of breath lightheadedness.  No prior history of PEs or DVTs.  Denies any cancer history no recent surgeries no recent travel.  No other complaints at this time.      Physical Exam   Triage Vital Signs: ED Triage Vitals  Encounter Vitals Group     BP 06/30/24 1517 (!) 151/91     Girls Systolic BP Percentile --      Girls Diastolic BP Percentile --      Boys Systolic BP Percentile --      Boys Diastolic BP Percentile --      Pulse Rate 06/30/24 1517 78     Resp 06/30/24 1517 18     Temp 06/30/24 1517 98.1 F (36.7 C)     Temp src --      SpO2 06/30/24 1517 100 %     Weight 06/30/24 1518 195 lb (88.5 kg)     Height 06/30/24 1518 6' 2 (1.88 m)     Head Circumference --      [facility] Flow --      Pain Score --      Pain Loc --      Pain Education --      Exclude from Growth Chart --     Most recent vital signs: Vitals:   06/30/24 1517  BP: (!) 151/91  Pulse: 78  Resp: 18  Temp: 98.1 F (36.7 C)  SpO2: 100%     General: Awake, no distress.  CV:  Good peripheral perfusion.  Resp:  Normal effort.  Abd:  No distention.  MSK:  Left lower extremity with 1+  pitting edema along the mid calf to the proximal ankle, pulses are appropriate normal sensation Other:     ED Results / Procedures / Treatments   Labs (all labs ordered are listed, but only abnormal results are displayed) Labs Reviewed  BASIC METABOLIC PANEL WITH GFR - Abnormal; Notable for the following components:      Result Value   Glucose, Bld 118 (*)    All other components within normal limits  PROTIME-INR  APTT  CBC WITH DIFFERENTIAL/PLATELET     EKG     RADIOLOGY   PROCEDURES:  Critical Care performed: No  Procedures   MEDICATIONS ORDERED IN ED: Medications  apixaban  (ELIQUIS ) tablet 10 mg (has no administration in time range)     IMPRESSION / MDM / ASSESSMENT AND PLAN / ED COURSE  I reviewed the triage vital signs and the nursing notes.  Patient's presentation is most consistent with acute complicated illness / injury requiring diagnostic workup.  73 year old male who presents today with concern of left leg swelling found to have a DVT on his ultrasound.  He appears well he is not in any acute distress.  Denies symptoms of chest pain shortness of breath.  Given the findings I discussed appropriate management, we will initiate Eliquis .  I discussed risk and possible contraindications for Eliquis  usage.  He verbalizes understanding, I discussed return precautions, we will have him discharged home at this time.       FINAL CLINICAL IMPRESSION(S) / ED DIAGNOSES   Final diagnoses:  DVT (deep vein thrombosis) in pregnancy     Rx / DC Orders   ED Discharge Orders          Ordered    APIXABAN  (ELIQUIS ) VTE STARTER PACK (10MG  AND 5MG )       Note to Pharmacy: If starter pack unavailable, substitute with seventy-four 5 mg apixaban  tabs following the above SIG directions.   06/30/24 1635             Note:  This document was prepared using Dragon voice recognition software and may include unintentional dictation  errors.   Fernand Rossie HERO, MD 06/30/24 [phone]

## 2024-06-30 NOTE — Telephone Encounter (Signed)
 Pt is aware and gave a verbal understanding.

## 2024-06-30 NOTE — Discharge Instructions (Signed)
 You were seen today due to concern of a blood clot noted on your ultrasound.  We have started you on a new medication called Eliquis .  This is a type of blood thinner, which would help resolve the blood clot however it puts you at higher risk of bleeding.  If you have any falls and hit her head, have any car crashes, or any other types of injury please return to the emergency department immediately for further medical management.  Also be sure to watch out for development of chest pain, shortness of breath, lightheadedness.  You need to follow-up with your primary doctor to discuss further evaluation of why you developed this blood clot please be sure to call them to arrange for a follow-up appointment.

## 2024-06-30 NOTE — ED Notes (Signed)
 Full rainbow sent to lab is blood needed.

## 2024-06-30 NOTE — ED Triage Notes (Signed)
 Pt comes with c/o abnormal US > Pt states they found clot in left leg. Pt not on thinners.

## 2024-06-30 NOTE — Progress Notes (Unsigned)
° °  Assessment & Plan:  There are no diagnoses linked to this encounter.   Return precautions given.   Risks, benefits, and alternatives of the medications and treatment plan prescribed today were discussed, and patient expressed understanding.   Education regarding symptom management and diagnosis given to patient on AVS either electronically or printed.  No follow-ups on file.  Rollene Northern, FNP  Subjective:    Patient ID: Brett Bradshaw, male    DOB: 1950/08/18, 73 y.o.   MRN: [id number]  CC: Brett Bradshaw is a 73 y.o. male who presents today for an acute visit.    HPI: HPI Seen 06/06/24 for left leg cellulitis Started on keflex  with some improvement  No ckd  Tdap 01/09/22  Allergies: Patient has no known allergies. Medications Ordered Prior to Encounter[1]  Review of Systems  Constitutional:  Negative for chills and fever.  Respiratory:  Negative for cough, shortness of breath and wheezing.   Cardiovascular:  Positive for leg swelling (left leg). Negative for chest pain and palpitations.  Gastrointestinal:  Negative for nausea and vomiting.  Skin:  Negative for rash and wound.      Objective:    BP 130/70   Pulse 98   Temp 97.7 F (36.5 C) (Oral)   Ht 6' 2 (1.88 m)   Wt 197 lb 6.4 oz (89.5 kg)   SpO2 97%   BMI 25.34 kg/m   BP Readings from Last 3 Encounters:  06/30/24 130/70  06/06/24 130/72  07/16/23 121/70   Wt Readings from Last 3 Encounters:  06/30/24 197 lb 6.4 oz (89.5 kg)  06/06/24 195 lb 12.8 oz (88.8 kg)  07/16/23 189 lb 14.4 oz (86.1 kg)     Physical Exam       [1]  Current Outpatient Medications on File Prior to Visit  Medication Sig Dispense Refill   bimatoprost (LUMIGAN) 0.01 % SOLN 1 drop nightly.      candesartan (ATACAND) 8 MG tablet Take 8 mg by mouth daily.     Cholecalciferol 50 MCG (2000 UT) CAPS Take 1 tablet by mouth daily.     azelastine  (ASTELIN ) 0.1 % nasal spray Place 1 spray into both nostrils 2 (two) times  daily. Use in each nostril as directed 1 mL 4   cephALEXin  (KEFLEX ) 500 MG capsule Take 1 capsule (500 mg total) by mouth 3 (three) times daily. 21 capsule 0   mupirocin  ointment (BACTROBAN ) 2 % Apply 1 Application topically 2 (two) times daily. 22 g 0   rosuvastatin  (CRESTOR ) 20 MG tablet Take 1 tablet (20 mg total) by mouth daily. 90 tablet 3   sildenafil  (VIAGRA ) 25 MG tablet TAKE (1) TABLET BY MOUTH ONCE DAILY AS NEEDED FOR ERECTILE DYSFUNCTION. 10 tablet 2   No current facility-administered medications on file prior to visit.

## 2024-06-30 NOTE — Patient Instructions (Signed)
 Please start using an Ace wrap during the daytime only.  You may even use on bilateral legs if you would like   based on your leg swelling, have ordered a stat ultrasound of your leg to rule out blood clot.    I have also ordered imagings to measure the arterial flow.  Let us  know if you dont hear back within 2 weeks in regards to an appointment being scheduled.   So that you are aware, if you are [facility] MyChart user , please pay attention to your MyChart messages as you may receive a MyChart message with a phone number to call and schedule this test/appointment own your own from our referral coordinator. This is a new process so I do not want you to miss this message.  If you are not a MyChart user, you will receive a phone call.    Please start doxycycline  to treat for underlying infection, inflammation  Ensure to take probiotics while on antibiotics and also for 2 weeks after completion. This can either be by eating yogurt daily or taking a probiotic supplement over the counter such as Culturelle.It is important to re-colonize the gut with good bacteria and also to prevent any diarrheal infections associated with antibiotic use.    Please let me know of any concerns or if leg swelling does not improve

## 2024-06-30 NOTE — Telephone Encounter (Signed)
 [facility] called to report STAT US  results. Brett Bradshaw stated that the US  is positive for DVT. Spoke to doc of the day, Dr Onesimo and he advised that pt go to the ED for treatment. Spoke with pt and gave him the information. Pt stated that he will go to [facility] ED.

## 2024-07-01 ENCOUNTER — Encounter: Payer: Self-pay | Admitting: Family

## 2024-07-01 ENCOUNTER — Other Ambulatory Visit: Payer: Self-pay | Admitting: Family

## 2024-07-01 ENCOUNTER — Ambulatory Visit: Payer: Self-pay | Admitting: Family

## 2024-07-01 DIAGNOSIS — I824Y2 Acute embolism and thrombosis of unspecified deep veins of left proximal lower extremity: Secondary | ICD-10-CM

## 2024-07-01 NOTE — Assessment & Plan Note (Signed)
 No acute respiratory distress.  Concern for DVT, superimposed on bilateral peripheral arterial disease. pending stat ultrasound. Start doxycycline  if DVT negative for concern for cellulitis. Close follow up.

## 2024-07-02 ENCOUNTER — Ambulatory Visit: Admission: RE | Admit: 2024-07-02 | Discharge: 2024-07-02 | Attending: Family

## 2024-07-02 DIAGNOSIS — L03116 Cellulitis of left lower limb: Secondary | ICD-10-CM | POA: Insufficient documentation

## 2024-07-04 NOTE — Telephone Encounter (Signed)
 Noted

## 2024-07-14 ENCOUNTER — Inpatient Hospital Stay

## 2024-07-14 ENCOUNTER — Encounter: Payer: Self-pay | Admitting: Oncology

## 2024-07-14 ENCOUNTER — Inpatient Hospital Stay: Attending: Oncology | Admitting: Oncology

## 2024-07-14 VITALS — BP 141/80 | HR 62 | Temp 96.1°F | Resp 18 | Wt 197.0 lb

## 2024-07-14 DIAGNOSIS — D696 Thrombocytopenia, unspecified: Secondary | ICD-10-CM | POA: Insufficient documentation

## 2024-07-14 DIAGNOSIS — I824Y2 Acute embolism and thrombosis of unspecified deep veins of left proximal lower extremity: Secondary | ICD-10-CM | POA: Diagnosis not present

## 2024-07-14 LAB — CBC WITH DIFFERENTIAL/PLATELET
Abs Immature Granulocytes: 0.04 K/uL (ref 0.00–0.07)
Basophils Absolute: 0 K/uL (ref 0.0–0.1)
Basophils Relative: 0 %
Eosinophils Absolute: 0.1 K/uL (ref 0.0–0.5)
Eosinophils Relative: 1 %
HCT: 45.5 % (ref 39.0–52.0)
Hemoglobin: 16.3 g/dL (ref 13.0–17.0)
Immature Granulocytes: 1 %
Lymphocytes Relative: 32 %
Lymphs Abs: 2.2 K/uL (ref 0.7–4.0)
MCH: 32.1 pg (ref 26.0–34.0)
MCHC: 35.8 g/dL (ref 30.0–36.0)
MCV: 89.6 fL (ref 80.0–100.0)
Monocytes Absolute: 0.5 K/uL (ref 0.1–1.0)
Monocytes Relative: 7 %
Neutro Abs: 4 K/uL (ref 1.7–7.7)
Neutrophils Relative %: 59 %
Platelets: 74 K/uL — ABNORMAL LOW (ref 150–400)
RBC: 5.08 MIL/uL (ref 4.22–5.81)
RDW: 13.1 % (ref 11.5–15.5)
WBC: 6.8 K/uL (ref 4.0–10.5)
nRBC: 0 % (ref 0.0–0.2)

## 2024-07-14 LAB — TECHNOLOGIST SMEAR REVIEW
Plt Morphology: NORMAL
RBC MORPHOLOGY: NORMAL
WBC MORPHOLOGY: NORMAL

## 2024-07-14 LAB — IMMATURE PLATELET FRACTION: Immature Platelet Fraction: 4.8 % (ref 1.2–8.6)

## 2024-07-14 LAB — FOLATE: Folate: >20 ng/mL

## 2024-07-14 LAB — VITAMIN B12: Vitamin B-12: 431 pg/mL (ref 180–914)

## 2024-07-14 LAB — ANTITHROMBIN III: AntiThromb III Func: 112 % (ref 75–120)

## 2024-07-14 MED ORDER — APIXABAN 5 MG PO TABS: 5.0000 mg | ORAL_TABLET | Freq: Two times a day (BID) | ORAL | 2 refills | Status: AC

## 2024-07-14 NOTE — Assessment & Plan Note (Signed)
 Unprovoked DVT suspected.  He did have a minor left lower extremity wound which healed quickly.  I doubt this is the provoking factor. I recommend patient to continue Eliquis  5 mg twice daily for 3 to 6 months. Repeat left lower extremity ultrasound prior to next visit in 3 months. Check hypercoagulable workup. Rationale and potential side effects of Eliquis  was reviewed and discussed with patient.

## 2024-07-14 NOTE — Assessment & Plan Note (Addendum)
 Longstanding thrombocytopenia with platelet counts in 70s-90ks. Etiology unclear, possibly immune-mediated. Current count 79k,  requires further evaluation due to increased bleeding risk with anticoagulation. Check HIV, hepatitis panel, immature platelet fraction, flow cytometry B12 folate.  Consider bone marrow biopsy in the future if platelet count progressively declines.

## 2024-07-14 NOTE — Progress Notes (Signed)
 " Hematology/Oncology Consult note [phone] [phone]        REFERRING PROVIDER: Dineen Rollene MATSU, FNP   CHIEF COMPLAINTS/REASON FOR VISIT:  Evaluation of left lower extremity DVT   ASSESSMENT & PLAN:   Acute deep vein thrombosis (DVT) of proximal vein of left lower extremity (HCC) Unprovoked DVT suspected.  He did have a minor left lower extremity wound which healed quickly.  I doubt this is the provoking factor. I recommend patient to continue Eliquis  5 mg twice daily for 3 to 6 months. Repeat left lower extremity ultrasound prior to next visit in 3 months. Check hypercoagulable workup. Rationale and potential side effects of Eliquis  was reviewed and discussed with patient.   Thrombocytopenia (HCC) Longstanding thrombocytopenia with platelet counts in 70s-90ks. Etiology unclear, possibly immune-mediated. Current count 79k,  requires further evaluation due to increased bleeding risk with anticoagulation. Check HIV, hepatitis panel, immature platelet fraction, flow cytometry B12 folate.  Consider bone marrow biopsy in the future if platelet count progressively declines.   Orders Placed This Encounter  Procedures   US  Venous Img Lower Unilateral Left    Standing Status:   Future    Expected Date:   09/08/2024    Expiration Date:   07/14/2025    Reason for Exam (SYMPTOM  OR DIAGNOSIS REQUIRED):   DVT follow up    Preferred imaging [facility]?:    [facility]    Standing Status:   Future    Number of Occurrences:   1    Expected Date:   07/14/2024    Expiration Date:   10/12/2024   Protein C, total    Standing Status:   Future    Number of Occurrences:   1    Expected Date:   07/14/2024    Expiration Date:   10/12/2024   Protein S, total and free    Standing Status:   Future    Number of Occurrences:   1    Expected Date:   07/14/2024    Expiration Date:   10/12/2024   Prothrombin gene mutation    Standing Status:   Future     Number of Occurrences:   1    Expected Date:   07/14/2024    Expiration Date:   10/12/2024   Protein C activity    Standing Status:   Future    Number of Occurrences:   1    Expected Date:   07/14/2024    Expiration Date:   10/12/2024   PNH Profile (-High Sensitivity)    Standing Status:   Future    Number of Occurrences:   1    Expected Date:   07/14/2024    Expiration Date:   10/12/2024   Antithrombin III    Standing Status:   Future    Number of Occurrences:   1    Expected Date:   07/14/2024    Expiration Date:   10/12/2024   CBC with Differential/Platelet    Standing Status:   Future    Number of Occurrences:   1    Expected Date:   07/14/2024    Expiration Date:   10/12/2024   Immature Platelet Fraction    Standing Status:   Future    Number of Occurrences:   1    Expected Date:   07/14/2024    Expiration Date:   10/12/2024   Vitamin B12    Standing Status:   Future  Number of Occurrences:   1    Expected Date:   07/14/2024    Expiration Date:   10/12/2024   Folate    Standing Status:   Future    Number of Occurrences:   1    Expected Date:   07/14/2024    Expiration Date:   10/12/2024   Flow cytometry panel-leukemia/lymphoma work-up    Standing Status:   Future    Number of Occurrences:   1    Expected Date:   07/14/2024    Expiration Date:   10/12/2024   Technologist smear review    Standing Status:   Future    Number of Occurrences:   1    Expected Date:   07/14/2024    Expiration Date:   07/14/2025    Clinical information::   low platelet   CBC with Differential (Cancer Center Only)    Standing Status:   Future    Expected Date:   09/22/2024    Expiration Date:   12/21/2024   CMP (Cancer Center only)    Standing Status:   Future    Expected Date:   09/22/2024    Expiration Date:   12/21/2024   Follow-up in 2 to 3 months All questions were answered. The patient knows to call the clinic with any problems, questions or concerns.  Zelphia Cap, MD, PhD [facility]  Hematology Oncology 07/14/2024   HISTORY OF PRESENTING ILLNESS:   [facility] is a  73 y.o.  male with PMH listed below was seen in consultation at the request of  Dineen Rollene MATSU, FNP  for evaluation of left lower extremity DVT  Discussed the use of AI scribe software for clinical note transcription with the patient, who gave verbal consent to proceed.    Patient was recently diagnosed with a left lower extremity DVT.  4 weeks prior to the diagnosis, he sustained minor trauma to his left leg, resulting in a small open wound with bleeding but no need for sutures. The wound healed without signs of infection. One week later, he developed left calf swelling, approximately one inch larger than the right, which remained stable. He denied fever, systemic symptoms, recent immobility, or long-distance travel.  He was evaluated by primary care provider and was initially prescribed antibiotics for presumed cellulitis which did not help resolving the swelling.  06/30/2024 venous ultrasound of left lower extremity showed 1. Deep venous thrombosis in the left lower extremity. Occlusive thrombus extends from the noncompressible left popliteal vein into the distal femoral vein. Nonocclusive thrombus within the peroneal and posterior tibial veins. 2. Irregular fluid collection in the left popliteal fossa measures 2.1 x 1.4 x 0.7 cm, which may represent a Baker's cyst.   He has chronic thrombocytopenia, with platelet counts ranging from the lower limit of normal to 70s-90s (x10^9/L) over 15-20 years. He has not experienced significant bleeding but notes easy bruising.  He denies unintentional weight loss, night sweats.  Patient has history of elevated PSA.  Patient was seen by oncology Dr. Gretta at [facility], last visit a year ago.  Patient is Up to date for colonoscopy.  Patient denies personal or family history of cancer.  MEDICAL HISTORY:  Past Medical History:  Diagnosis Date   Chicken pox     Glaucoma    Bilateral eyes   Hypertension    Sarcoidosis 1996    SURGICAL HISTORY: Past Surgical History:  Procedure Laterality Date   COLONOSCOPY WITH PROPOFOL  N/A 12/06/2021   Procedure:  COLONOSCOPY WITH PROPOFOL ;  Surgeon: Therisa Bi, MD;  Location: [facility] ENDOSCOPY;  Service: Gastroenterology;  Laterality: N/A;   pyloric stenosis     newborn    SOCIAL HISTORY: Social History   Socioeconomic History   Marital status: Married    Spouse name: Not on file   Number of children: Not on file   Years of education: Not on file   Highest education level: Master's degree (e.g., MA, MS, MEng, MEd, MSW, MBA)  Occupational History   Not on file  Tobacco Use   Smoking status: Never   Smokeless tobacco: Never  Vaping Use   Vaping status: Never Used  Substance and Sexual Activity   Alcohol use: Yes    Alcohol/week: 5.0 standard drinks of alcohol    Types: 5 Standard drinks or equivalent per week    Comment: weekly   Drug use: No   Sexual activity: Yes    Partners: Female    Comment: Wife  Other Topics Concern   Not on file  Social History Narrative   Works as a film/video editor ,    Lives with wife, CHARITY FUNDRAISER.    Children- 2    Pets: none currently    Caffeine- No soda, 1 cup of coffee daily    Enjoys keeping up with financial information    Social Drivers of Health   Tobacco Use: Low Risk (07/14/2024)   Patient History    Smoking Tobacco Use: Never    Smokeless Tobacco Use: Never    Passive Exposure: Not on file  Financial Resource Strain: Low Risk (07/14/2024)   Overall Financial Resource Strain (CARDIA)    Difficulty of Paying Living Expenses: Not very hard  Food Insecurity: No Food Insecurity (07/14/2024)   Epic    Worried About Radiation Protection Practitioner of Food in the Last Year: Never true    Ran Out of Food in the Last Year: Never true  Transportation Needs: No Transportation Needs (07/14/2024)   Epic    Lack of Transportation (Medical): No    Lack of Transportation (Non-Medical):  No  Physical Activity: Sufficiently Active (06/04/2024)   Exercise Vital Sign    Days of Exercise per Week: 5 days    Minutes of Exercise per Session: 40 min  Stress: No Stress Concern Present (07/14/2024)   [employer] of Occupational Health - Occupational Stress Questionnaire    Feeling of Stress: Not at all  Social Connections: Socially Integrated (06/04/2024)   Social Connection and Isolation Panel    Frequency of Communication with Friends and Family: Three times a week    Frequency of Social Gatherings with Friends and Family: Once a week    Attends Religious Services: More than 4 times per year    Active Member of Clubs or Organizations: Yes    Attends Banker Meetings: More than 4 times per year    Marital Status: Married  Catering Manager Violence: Not At Risk (07/14/2024)   Epic    Fear of Current or Ex-Partner: No    Emotionally Abused: No    Physically Abused: No    Sexually Abused: No  Depression (PHQ2-9): Low Risk (07/14/2024)   Depression (PHQ2-9)    PHQ-2 Score: 0  Alcohol Screen: Low Risk (06/04/2024)   Alcohol Screen    Last Alcohol Screening Score (AUDIT): 3  Housing: Low Risk (07/14/2024)   Epic    Unable to Pay for Housing in the Last Year: No    Number of Times Moved in the Last  Year: 0    Homeless in the Last Year: No  Utilities: Not At Risk (07/14/2024)   Epic    Threatened with loss of utilities: No  Health Literacy: Adequate Health Literacy (07/14/2024)   B1300 Health Literacy    Frequency of need for help with medical instructions: Never    FAMILY HISTORY: Family History  Problem Relation Age of Onset   Cancer Paternal Uncle        Prostate   Diabetes Brother        Type I   Dementia Mother    Stroke Mother    COPD Father    Hypertension Father    Heart disease Father    Kidney cancer Neg Hx    Bladder Cancer Neg Hx     ALLERGIES:  has no known allergies.  MEDICATIONS:  Current Outpatient Medications   Medication Sig Dispense Refill   [START ON 07/24/2024] apixaban  (ELIQUIS ) 5 MG TABS tablet Take 1 tablet (5 mg total) by mouth 2 (two) times daily. 60 tablet 2   APIXABAN  (ELIQUIS ) VTE STARTER PACK (10MG  AND 5MG ) Take as directed on package: start with two-5mg  tablets twice daily for 7 days. On day 8, switch to one-5mg  tablet twice daily. 74 each 0   bimatoprost (LUMIGAN) 0.01 % SOLN 1 drop nightly.      candesartan (ATACAND) 8 MG tablet Take 8 mg by mouth daily.     Cholecalciferol 50 MCG (2000 UT) CAPS Take 1 tablet by mouth daily.     doxycycline  (VIBRA -TABS) 100 MG tablet Take 1 tablet (100 mg total) by mouth 2 (two) times daily for 7 days. 14 tablet 0   No current facility-administered medications for this visit.    Review of Systems  Constitutional:  Negative for appetite change, chills, fatigue and fever.  HENT:   Negative for hearing loss and voice change.   Eyes:  Negative for eye problems.  Respiratory:  Negative for chest tightness and cough.   Cardiovascular:  Positive for leg swelling. Negative for chest pain.  Gastrointestinal:  Negative for abdominal distention, abdominal pain and blood in stool.  Endocrine: Negative for hot flashes.  Genitourinary:  Negative for difficulty urinating and frequency.   Musculoskeletal:  Negative for arthralgias.  Skin:  Negative for itching and rash.  Neurological:  Negative for extremity weakness.  Hematological:  Negative for adenopathy.  Psychiatric/Behavioral:  Negative for confusion.    PHYSICAL EXAMINATION:  Vitals:   07/14/24 0955 07/14/24 1008  BP: (!) 151/78 (!) 141/80  Pulse: 62   Resp: 18   Temp: (!) 96.1 F (35.6 C)   SpO2: 100%    Filed Weights   07/14/24 0955  Weight: 197 lb (89.4 kg)    Physical Exam Constitutional:      General: He is not in acute distress. HENT:     Head: Normocephalic and atraumatic.  Eyes:     General: No scleral icterus. Cardiovascular:     Rate and Rhythm: Normal rate and regular  rhythm.  Pulmonary:     Effort: Pulmonary effort is normal. No respiratory distress.     Breath sounds: Normal breath sounds. No wheezing.  Abdominal:     General: Bowel sounds are normal. There is no distension.     Palpations: Abdomen is soft.  Musculoskeletal:        General: No deformity. Normal range of motion.     Cervical back: Normal range of motion and neck supple.     Right lower leg:  No edema.     Left lower leg: Edema present.  Skin:    General: Skin is warm and dry.     Findings: No erythema or rash.  Neurological:     Mental Status: He is alert and oriented to person, place, and time. Mental status is at baseline.  Psychiatric:        Mood and Affect: Mood normal.     LABORATORY DATA:  I have reviewed the data as listed    Latest Ref Rng & Units 07/14/2024   10:57 AM 06/30/2024    3:25 PM 07/16/2023   12:04 PM  CBC  WBC 4.0 - 10.5 K/uL 6.8  5.8  7.4   Hemoglobin 13.0 - 17.0 g/dL 83.6  84.0  83.6   Hematocrit 39.0 - 52.0 % 45.5  45.1  47.5   Platelets 150 - 400 K/uL 74  79  133.0       Latest Ref Rng & Units 06/30/2024    3:25 PM 07/16/2023   12:04 PM 11/11/2021    2:36 PM  CMP  Glucose 70 - 99 mg/dL 881  91  89   BUN 8 - 23 mg/dL 14  19  16    Creatinine 0.61 - 1.24 mg/dL 9.00  8.86  8.88   Sodium 135 - 145 mmol/L 140  140  140   Potassium 3.5 - 5.1 mmol/L 4.3  4.4  4.2   Chloride 98 - 111 mmol/L 105  102  106   CO2 22 - 32 mmol/L 26  29  28    Calcium  8.9 - 10.3 mg/dL 9.6  9.5  9.3   Total Protein 6.0 - 8.3 g/dL  6.7    Total Bilirubin 0.2 - 1.2 mg/dL  1.5    Alkaline Phos 39 - 117 U/L  43    AST 0 - 37 U/L  15    ALT 0 - 53 U/L  12        RADIOGRAPHIC STUDIES: I have personally reviewed the radiological images as listed and agreed with the findings in the report. US  ARTERIAL ABI (SCREENING LOWER EXTREMITY) Result Date: 07/02/2024 CLINICAL DATA:  Screening evaluation for peripheral arterial disease EXAM: NONINVASIVE PHYSIOLOGIC VASCULAR STUDY  OF BILATERAL LOWER EXTREMITIES TECHNIQUE: Evaluation of both lower extremities were performed at rest, including calculation of ankle-brachial indices with single level Doppler, pressure and pulse volume recording. COMPARISON:  None Available. FINDINGS: Right ABI:  1.2 Left ABI:  1.2 Right Lower Extremity:  Normal arterial waveforms at the ankle. Left Lower Extremity:  Normal arterial waveforms at the ankle. 1.0-1.4 Normal IMPRESSION: Normal exam. No evidence of hemodynamically significant peripheral arterial disease. Electronically Signed   By: Wilkie Lent M.D.   On: 07/02/2024 14:04   US  Venous Img Lower Unilateral Left Result Date: 06/30/2024 CLINICAL DATA:  Left calf swelling and cellulitis after laceration EXAM: LEFT LOWER EXTREMITY VENOUS DOPPLER ULTRASOUND TECHNIQUE: Gray-scale sonography with compression, as well as color and duplex ultrasound, were performed to evaluate the deep venous system(s) from the level of the common femoral vein through the popliteal and proximal calf veins. COMPARISON:  None Available. FINDINGS: VENOUS Occlusive thrombus extends from the noncompressible popliteal vein into the distal femoral vein. Nonocclusive thrombus within the peroneal and posterior tibial veins. Normal compressibility of the common femoral and proximal and mid superficial femoral veins. Visualized portions of profunda femoral vein and great saphenous vein unremarkable. No filling defects to suggest DVT on grayscale or color Doppler imaging. Doppler waveforms show  normal direction of venous flow, normal respiratory plasticity and response to augmentation. Limited views of the contralateral common femoral vein are unremarkable. OTHER Irregular fluid collection in the left popliteal fossa contains heterogeneous internal echogenicities and measures 2.1 x 1.4 x 0.7 cm. Limitations: none IMPRESSION: 1. Deep venous thrombosis in the left lower extremity. Occlusive thrombus extends from the noncompressible  left popliteal vein into the distal femoral vein. Nonocclusive thrombus within the peroneal and posterior tibial veins. 2. Irregular fluid collection in the left popliteal fossa measures 2.1 x 1.4 x 0.7 cm, which may represent a Baker's cyst. These results will be called to the ordering clinician or representative by the sonographer. Electronically Signed   By: Limin  Xu M.D.   On: 06/30/2024 14:19         "

## 2024-07-15 LAB — PROTEIN C ACTIVITY: Protein C Activity: 108 % (ref 73–180)

## 2024-07-15 LAB — PROTEIN S, TOTAL AND FREE
Protein S Ag, Free: 107 % (ref 61–136)
Protein S Ag, Total: 98 % (ref 60–150)

## 2024-07-16 LAB — COMP PANEL: LEUKEMIA/LYMPHOMA

## 2024-07-17 LAB — PROTEIN C, TOTAL: Protein C, Total: 93 % (ref 60–150)

## 2024-07-18 LAB — PNH PROFILE (-HIGH SENSITIVITY)

## 2024-07-18 LAB — PROTHROMBIN GENE MUTATION

## 2024-07-18 LAB — FACTOR 5 LEIDEN

## 2024-07-22 ENCOUNTER — Ambulatory Visit: Payer: Medicare Other | Admitting: Family

## 2024-07-22 ENCOUNTER — Encounter: Payer: Self-pay | Admitting: Family

## 2024-07-22 VITALS — BP 132/78 | HR 64 | Temp 98.3°F | Ht 74.0 in | Wt 195.2 lb

## 2024-07-22 DIAGNOSIS — E785 Hyperlipidemia, unspecified: Secondary | ICD-10-CM

## 2024-07-22 DIAGNOSIS — D696 Thrombocytopenia, unspecified: Secondary | ICD-10-CM | POA: Diagnosis not present

## 2024-07-22 DIAGNOSIS — I824Y2 Acute embolism and thrombosis of unspecified deep veins of left proximal lower extremity: Secondary | ICD-10-CM | POA: Diagnosis not present

## 2024-07-22 DIAGNOSIS — R972 Elevated prostate specific antigen [PSA]: Secondary | ICD-10-CM | POA: Diagnosis not present

## 2024-07-22 DIAGNOSIS — I10 Essential (primary) hypertension: Secondary | ICD-10-CM

## 2024-07-22 LAB — CBC WITH DIFFERENTIAL/PLATELET
Basophils Absolute: 0 K/uL (ref 0.0–0.1)
Basophils Relative: 0.1 % (ref 0.0–3.0)
Eosinophils Absolute: 0.1 K/uL (ref 0.0–0.7)
Eosinophils Relative: 1.2 % (ref 0.0–5.0)
HCT: 49 % (ref 39.0–52.0)
Hemoglobin: 17.1 g/dL — ABNORMAL HIGH (ref 13.0–17.0)
Lymphocytes Relative: 25.2 % (ref 12.0–46.0)
Lymphs Abs: 2 K/uL (ref 0.7–4.0)
MCHC: 34.8 g/dL (ref 30.0–36.0)
MCV: 92.1 fl (ref 78.0–100.0)
Monocytes Absolute: 0.6 K/uL (ref 0.1–1.0)
Monocytes Relative: 8.3 % (ref 3.0–12.0)
Neutro Abs: 5 K/uL (ref 1.4–7.7)
Neutrophils Relative %: 65.2 % (ref 43.0–77.0)
Platelets: 80 K/uL — ABNORMAL LOW (ref 150.0–400.0)
RBC: 5.32 Mil/uL (ref 4.22–5.81)
RDW: 14.5 % (ref 11.5–15.5)
WBC: 7.7 K/uL (ref 4.0–10.5)

## 2024-07-22 LAB — PSA: PSA: 8.8 ng/mL — ABNORMAL HIGH (ref 0.10–4.00)

## 2024-07-22 LAB — TSH: TSH: 1.17 u[IU]/mL (ref 0.35–5.50)

## 2024-07-22 LAB — LIPID PANEL
Cholesterol: 194 mg/dL (ref 28–200)
HDL: 50.4 mg/dL
LDL Cholesterol: 69 mg/dL (ref 10–99)
NonHDL: 143.85
Total CHOL/HDL Ratio: 4
Triglycerides: 375 mg/dL — ABNORMAL HIGH (ref 10.0–149.0)
VLDL: 75 mg/dL — ABNORMAL HIGH (ref 0.0–40.0)

## 2024-07-22 LAB — HEPATIC FUNCTION PANEL
ALT: 16 U/L (ref 3–53)
AST: 24 U/L (ref 5–37)
Albumin: 4.9 g/dL (ref 3.5–5.2)
Alkaline Phosphatase: 53 U/L (ref 39–117)
Bilirubin, Direct: 0.2 mg/dL (ref 0.1–0.3)
Total Bilirubin: 1.3 mg/dL — ABNORMAL HIGH (ref 0.2–1.2)
Total Protein: 7.4 g/dL (ref 6.0–8.3)

## 2024-07-22 NOTE — Assessment & Plan Note (Addendum)
 Reviewed hematology consult.  Continue Eliquis  5 mg twice daily.  Will defer to hematology regarding lifelong requirement of anticoagulation.

## 2024-07-22 NOTE — Progress Notes (Signed)
 "  Assessment & Plan:  Elevated PSA Assessment & Plan: Last seen [facility] 03/16/2023 for elevated PSA. He states that he is returning to [facility] for ongoing PSA surveillance. He would like PSA checked today with our office.   Orders: -     PSA  Hyperlipidemia, unspecified hyperlipidemia type -     Lipid panel -     Hepatic function panel  Essential hypertension -     TSH -     Hepatic function panel  Thrombocytopenia (HCC) -     CBC with Differential/Platelet  Acute deep vein thrombosis (DVT) of proximal vein of left lower extremity [facility]) Assessment & Plan: Reviewed hematology consult.  Continue Eliquis  5 mg twice daily.  Will defer to hematology regarding lifelong requirement of anticoagulation.       Return precautions given.   Risks, benefits, and alternatives of the medications and treatment plan prescribed today were discussed, and patient expressed understanding.   Education regarding symptom management and diagnosis given to patient on AVS either electronically or printed.  Return in about 6 months (around 01/19/2025).  Rollene Northern, FNP  Subjective:    Patient ID: Brett [facility], male    DOB: 02/24/1951, 74 y.o.   MRN: [id number]  CC: [facility] is a 74 y.o. male who presents today for follow up.   HPI: Feels well today.  No new complaints   compliant with Eliquis  5 twice daily.  Denies bleeding, shortness of breath. Leg swelling largely resolved.  He is compliant with compression stockings   Seen the emergency room 06/30/2024 for left leg DVT confirmed by venous US  Consult Dr. Babara, hematology 07/14/2024 for left leg DVT, thrombocytopenia Plt 79K Continue eliquis  5mg  BID x 3-6 mo will continue follow-ups; repeat US  venous.  Pending hypercoagulable work up   Narrative & Impression      IMPRESSION: 1. Deep venous thrombosis in the left lower extremity. Occlusive thrombus extends from the noncompressible left popliteal vein into the  distal femoral vein. Nonocclusive thrombus within the peroneal and posterior tibial veins. 2. Irregular fluid collection in the left popliteal fossa measures 2.1 x 1.4 x 0.7 cm, which may represent a Baker's cyst.   Follow up with Dr Homer , cardiology 09/01/24 He is taking crestor  20mg  every other day.   Last seen [facility] 03/16/2023 for elevated PSA. He is returning to [facility] for ongoing PSA surveillance.   Denies urinary hesitancy, decreased urine flow.    Allergies: Patient has no known allergies. Medications Ordered Prior to Encounter[1]  Review of Systems  Constitutional:  Negative for chills and fever.  Respiratory:  Negative for cough.   Cardiovascular:  Negative for chest pain and palpitations.  Gastrointestinal:  Negative for nausea and vomiting.  Genitourinary:  Negative for decreased urine volume and difficulty urinating.  Hematological:  Does not bruise/bleed easily.      Objective:    BP 132/78   Pulse 64   Temp 98.3 F (36.8 C)   Ht 6' 2 (1.88 m)   Wt 195 lb 3.2 oz (88.5 kg)   SpO2 99%   BMI 25.06 kg/m  BP Readings from Last 3 Encounters:  07/22/24 132/78  07/14/24 (!) 141/80  06/30/24 (!) 151/91   Wt Readings from Last 3 Encounters:  07/22/24 195 lb 3.2 oz (88.5 kg)  07/14/24 197 lb (89.4 kg)  06/30/24 195 lb (88.5 kg)    Physical Exam Vitals reviewed.  Constitutional:      Appearance: He is well-developed.  Cardiovascular:     Rate and Rhythm: Regular rhythm.     Heart sounds: Normal heart sounds.     Comments: ACE wrap left LE Pulmonary:     Effort: Pulmonary effort is normal. No respiratory distress.     Breath sounds: Normal breath sounds. No wheezing, rhonchi or rales.  Musculoskeletal:     Right lower leg: No edema.     Left lower leg: No edema.  Skin:    General: Skin is warm and dry.  Neurological:     Mental Status: He is alert.  Psychiatric:        Speech: Speech normal.        Behavior: Behavior normal.            [1]  Current Outpatient Medications on File Prior to Visit  Medication Sig Dispense Refill   [START ON 07/24/2024] apixaban  (ELIQUIS ) 5 MG TABS tablet Take 1 tablet (5 mg total) by mouth 2 (two) times daily. 60 tablet 2   bimatoprost (LUMIGAN) 0.01 % SOLN 1 drop nightly.      candesartan (ATACAND) 8 MG tablet Take 8 mg by mouth daily.     Cholecalciferol 50 MCG (2000 UT) CAPS Take 1 tablet by mouth daily.     rosuvastatin  (CRESTOR ) 20 MG tablet Take 20 mg by mouth every other day. To every 3-4 days     sildenafil  (VIAGRA ) 25 MG tablet Take 25 mg by mouth.     No current facility-administered medications on file prior to visit.   "

## 2024-07-22 NOTE — Assessment & Plan Note (Addendum)
 Last seen [facility] 03/16/2023 for elevated PSA. He states that he is returning to [facility] oncology for ongoing PSA surveillance. He would like PSA checked today with our office.

## 2024-08-01 ENCOUNTER — Ambulatory Visit: Payer: Self-pay | Admitting: Family

## 2024-09-08 ENCOUNTER — Other Ambulatory Visit

## 2024-09-22 ENCOUNTER — Inpatient Hospital Stay

## 2024-10-06 ENCOUNTER — Inpatient Hospital Stay: Admitting: Oncology

## 2024-10-07 ENCOUNTER — Inpatient Hospital Stay: Admitting: Oncology

## 2025-01-19 ENCOUNTER — Ambulatory Visit: Admitting: Family
